# Patient Record
Sex: Female | Born: 1970 | Hispanic: No | Marital: Married | Smoking: Never smoker
Health system: Southern US, Community
[De-identification: ages and names within clinical notes are randomized; demographics above are authoritative.]

## PROBLEM LIST (undated history)

## (undated) DIAGNOSIS — D472 Monoclonal gammopathy: Secondary | ICD-10-CM

## (undated) DIAGNOSIS — I10 Essential (primary) hypertension: Secondary | ICD-10-CM

## (undated) DIAGNOSIS — G43909 Migraine, unspecified, not intractable, without status migrainosus: Secondary | ICD-10-CM

## (undated) DIAGNOSIS — J45909 Unspecified asthma, uncomplicated: Secondary | ICD-10-CM

## (undated) DIAGNOSIS — M069 Rheumatoid arthritis, unspecified: Secondary | ICD-10-CM

## (undated) HISTORY — DX: Essential (primary) hypertension: I10

## (undated) HISTORY — DX: Migraine, unspecified, not intractable, without status migrainosus: G43.909

## (undated) HISTORY — DX: Unspecified asthma, uncomplicated: J45.909

---

## 2000-09-18 ENCOUNTER — Other Ambulatory Visit: Admission: RE | Admit: 2000-09-18 | Discharge: 2000-09-18 | Payer: Self-pay | Admitting: Gynecology

## 2002-05-13 HISTORY — PX: REDUCTION MAMMAPLASTY: SUR839

## 2003-05-14 HISTORY — PX: BREAST REDUCTION SURGERY: SHX8

## 2004-05-13 HISTORY — PX: CHOLECYSTECTOMY: SHX55

## 2005-05-13 HISTORY — PX: BREAST BIOPSY: SHX20

## 2005-08-14 ENCOUNTER — Emergency Department (HOSPITAL_COMMUNITY): Admission: EM | Admit: 2005-08-14 | Discharge: 2005-08-14 | Payer: Self-pay | Admitting: Family Medicine

## 2005-08-29 ENCOUNTER — Encounter: Payer: Self-pay | Admitting: Internal Medicine

## 2005-09-13 ENCOUNTER — Emergency Department (HOSPITAL_COMMUNITY): Admission: EM | Admit: 2005-09-13 | Discharge: 2005-09-13 | Payer: Self-pay | Admitting: Family Medicine

## 2005-09-16 ENCOUNTER — Inpatient Hospital Stay (HOSPITAL_COMMUNITY): Admission: AD | Admit: 2005-09-16 | Discharge: 2005-09-16 | Payer: Self-pay | Admitting: *Deleted

## 2007-06-16 ENCOUNTER — Ambulatory Visit: Payer: Self-pay | Admitting: Internal Medicine

## 2007-06-16 DIAGNOSIS — J4550 Severe persistent asthma, uncomplicated: Secondary | ICD-10-CM | POA: Insufficient documentation

## 2007-06-16 DIAGNOSIS — J45909 Unspecified asthma, uncomplicated: Secondary | ICD-10-CM

## 2007-06-18 ENCOUNTER — Telehealth (INDEPENDENT_AMBULATORY_CARE_PROVIDER_SITE_OTHER): Payer: Self-pay | Admitting: *Deleted

## 2007-06-24 ENCOUNTER — Telehealth: Payer: Self-pay | Admitting: Internal Medicine

## 2007-06-24 ENCOUNTER — Ambulatory Visit: Payer: Self-pay | Admitting: Internal Medicine

## 2007-07-01 ENCOUNTER — Telehealth: Payer: Self-pay | Admitting: Internal Medicine

## 2007-07-02 ENCOUNTER — Telehealth (INDEPENDENT_AMBULATORY_CARE_PROVIDER_SITE_OTHER): Payer: Self-pay | Admitting: *Deleted

## 2007-07-08 ENCOUNTER — Ambulatory Visit: Payer: Self-pay | Admitting: Internal Medicine

## 2007-07-16 ENCOUNTER — Ambulatory Visit: Payer: Self-pay | Admitting: Internal Medicine

## 2007-07-20 ENCOUNTER — Telehealth (INDEPENDENT_AMBULATORY_CARE_PROVIDER_SITE_OTHER): Payer: Self-pay | Admitting: *Deleted

## 2007-07-21 ENCOUNTER — Telehealth (INDEPENDENT_AMBULATORY_CARE_PROVIDER_SITE_OTHER): Payer: Self-pay | Admitting: *Deleted

## 2007-07-31 ENCOUNTER — Ambulatory Visit: Payer: Self-pay | Admitting: Internal Medicine

## 2007-08-04 ENCOUNTER — Telehealth: Payer: Self-pay | Admitting: Internal Medicine

## 2007-09-08 ENCOUNTER — Ambulatory Visit: Payer: Self-pay | Admitting: Internal Medicine

## 2007-10-07 ENCOUNTER — Telehealth: Payer: Self-pay | Admitting: Internal Medicine

## 2007-10-08 ENCOUNTER — Ambulatory Visit: Payer: Self-pay | Admitting: Internal Medicine

## 2007-10-22 ENCOUNTER — Ambulatory Visit: Payer: Self-pay | Admitting: Internal Medicine

## 2007-11-02 ENCOUNTER — Encounter: Payer: Self-pay | Admitting: Adult Health

## 2007-11-02 ENCOUNTER — Ambulatory Visit: Payer: Self-pay | Admitting: Internal Medicine

## 2007-11-02 ENCOUNTER — Telehealth: Payer: Self-pay | Admitting: Internal Medicine

## 2007-11-02 DIAGNOSIS — J019 Acute sinusitis, unspecified: Secondary | ICD-10-CM | POA: Insufficient documentation

## 2007-11-03 ENCOUNTER — Telehealth (INDEPENDENT_AMBULATORY_CARE_PROVIDER_SITE_OTHER): Payer: Self-pay | Admitting: *Deleted

## 2007-11-06 ENCOUNTER — Telehealth (INDEPENDENT_AMBULATORY_CARE_PROVIDER_SITE_OTHER): Payer: Self-pay | Admitting: *Deleted

## 2007-11-19 ENCOUNTER — Ambulatory Visit: Payer: Self-pay | Admitting: Internal Medicine

## 2008-01-22 ENCOUNTER — Telehealth (INDEPENDENT_AMBULATORY_CARE_PROVIDER_SITE_OTHER): Payer: Self-pay | Admitting: *Deleted

## 2008-01-22 ENCOUNTER — Ambulatory Visit: Payer: Self-pay | Admitting: Internal Medicine

## 2008-02-11 ENCOUNTER — Telehealth: Payer: Self-pay | Admitting: Internal Medicine

## 2008-03-31 ENCOUNTER — Ambulatory Visit: Payer: Self-pay | Admitting: Internal Medicine

## 2008-07-01 ENCOUNTER — Ambulatory Visit: Payer: Self-pay | Admitting: Internal Medicine

## 2008-08-09 ENCOUNTER — Telehealth (INDEPENDENT_AMBULATORY_CARE_PROVIDER_SITE_OTHER): Payer: Self-pay | Admitting: *Deleted

## 2008-08-09 ENCOUNTER — Ambulatory Visit: Payer: Self-pay | Admitting: Internal Medicine

## 2013-07-02 ENCOUNTER — Encounter: Payer: Self-pay | Admitting: Internal Medicine

## 2013-07-02 ENCOUNTER — Ambulatory Visit (INDEPENDENT_AMBULATORY_CARE_PROVIDER_SITE_OTHER): Payer: BC Managed Care – PPO | Admitting: Internal Medicine

## 2013-07-02 VITALS — BP 124/78 | HR 95 | Temp 98.2°F | Ht 62.0 in | Wt 177.0 lb

## 2013-07-02 DIAGNOSIS — J45909 Unspecified asthma, uncomplicated: Secondary | ICD-10-CM

## 2013-07-02 MED ORDER — PREDNISONE 10 MG PO TABS: ORAL_TABLET | ORAL | Status: DC

## 2013-07-02 NOTE — Progress Notes (Signed)
Subjective:    Patient ID: Alexis Hall, female    DOB: 1971/03/20, 43 y.o.   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   History of Present Illness   07/02/2013 1st [place] [facility] office visit/ Epic Era Rydell Wiegel  Chief Complaint  Patient presents with  . Pulmonary Consult    Pt last seen in 2010. She c/o increased cough, chest tightness and SOB for the past 3-4 wks. Cough is non prod. She is SOB with or without any exertion. Using rescue inhaler approx 4 x per day.    Still on shots per vanWinkle plus singulair plus symbicort 160 and prn ppi   Caught a cold with sore throat then cough about one month prior to OV   > then tightness > rx zpak / predn and omeprazole 20 ac  Cough and sob tends to peak with cold air and activity and yet not  While sleeping, proair every 6 hours on avg whereas prior to onset not needing saba at all  No obvious day to day or daytime variabilty or assoc   cp or chest tightness, subjective wheeze overt sinus or hb symptoms. No unusual exp hx or h/o childhood pna/ asthma or knowledge of premature birth.  Sleeping ok without nocturnal  or early am exacerbation  of respiratory  c/o's or need for noct saba. Also denies any obvious fluctuation of symptoms with weather or environmental changes or other aggravating or alleviating factors except as outlined above   Current Medications, Allergies, Complete Past Medical History, Past Surgical History, Family History, and Social History were reviewed in [facility] electronic medical record.                Past Medical History:  EXTRINSIC ASTHMA, UNSPECIFIED (ICD-493.00)  - Allergy shots per Dr Jethro BolusGene [place]  - HFA 50% August 09, 2008  Depression  GERD    Family History:  allergies in mother  hypertension in mother  and father    Social History:  Patient never smoked.  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  HENT: Negative for congestion, dental problem, ear pain, nosebleeds, postnasal drip, rhinorrhea, sinus pressure, sneezing, sore throat, trouble swallowing and voice change.   Eyes: Negative for visual disturbance.  Respiratory: Positive for cough and shortness of breath. Negative for choking.   Cardiovascular: Negative for chest pain and leg swelling.  Gastrointestinal: Negative for vomiting, abdominal pain and diarrhea.  Genitourinary: Negative for difficulty urinating.  Musculoskeletal: Negative for arthralgias.  Skin: Negative for rash.  Neurological: Negative for tremors, syncope and headaches.  Hematological: Does not bruise/bleed easily.       Objective:   Physical Exam    Ambulatory healthy appearing in no acute distress. wt 178 > 185 August 09, 2008 > 07/02/2013 178  Afeb with normal vital signs  HEENT: nl dentition, turbinates, and orophanx. Nl external ear canals without cough reflex, sinus tenderness  Neck without JVD/Nodes/TM  Lungs clear to A and P bilaterally without cough on insp or exp maneuvers  RRR no s3 or murmur or increase in P2, no edema  Abd soft and benign with nl excursion in  the supine position. No bruits or organomegaly  Ext warm without calf tenderness, cyanosis clubbing  Skin warm and dry without lesions   CT chest since onset of present illness in [place] negative      Assessment & Plan:

## 2013-07-02 NOTE — Patient Instructions (Addendum)
Continue symbicort 160 Take 2 puffs first thing in am and then another 2 puffs about 12 hours later.   Work on inhaler technique:  relax and gently blow all the way out then take a nice smooth deep breath back in, triggering the inhaler at same time you start breathing in.  Hold for up to 5 seconds if you can.  Rinse and gargle with water when done   If your mouth or throat starts to bother you,   I suggest you time the inhaler to your dental care and after using the inhaler(s) brush teeth and tongue with a baking soda containing toothpaste and when you rinse this out, gargle with it first to see if this helps your mouth and throat.     Only use your albuterol as a rescue medication to be used if you can't catch your breath by resting or doing a relaxed purse lip breathing pattern.  - The less you use it, the better it will work when you need it. - Ok to use up to 2 puffs  every 4 hours if you must but call for immediate appointment if use goes up over your usual need - Don't leave home without it !!  (think of it like the spare tire for your car)   Omeprazole 20 mg take x 2 both before bfast and supper and add chlortrimeton 4 mg at bedtime  Prednisone 10 mg take  4 each am x 2 days,   2 each am x 2 days,  1 each am x 2 days and stop   Take delsym two tsp every 12 hours and supplement if needed with oxycodone up to 2 every 4 hours  to suppress the urge to cough. Swallowing water or using ice chips/non mint and menthol containing candies (such as lifesavers or sugarless jolly ranchers) are also effective.  You should rest your voice and avoid activities that you know make you cough.  Once you have eliminated the cough for 3 straight days try reducing the oxycone  first,  then the delsym as tolerated.    GERD (REFLUX)  is an extremely common cause of respiratory symptoms, many times with no significant heartburn at all.    It can be treated with medication, but also with lifestyle changes  including avoidance of late meals, excessive alcohol, smoking cessation, and avoid fatty foods, chocolate, peppermint, colas, red wine, and acidic juices such as orange juice.  NO MINT OR MENTHOL PRODUCTS SO NO COUGH DROPS  USE SUGARLESS CANDY INSTEAD (jolley ranchers or Stover's)  NO OIL BASED VITAMINS - use powdered substitutes.

## 2013-07-03 NOTE — Assessment & Plan Note (Addendum)
DDX of  difficult airways managment all start with A and  include Adherence, Ace Inhibitors, Acid Reflux, Active Sinus Disease, Alpha 1 Antitripsin deficiency, Anxiety masquerading as Airways dz,  ABPA,  allergy(esp in young), Aspiration (esp in elderly), Adverse effects of DPI,  Active smokers, plus two Bs  = Bronchiectasis and Beta blocker use..and one C= CHF  Adherence is always the initial "prime suspect" and is a multilayered concern that requires a "trust but verify" approach in every patient - starting with knowing how to use medications, especially inhalers, correctly, keeping up with refills and understanding the fundamental difference between maintenance and prns vs those medications only taken for a very short course and then stopped and not refilled. The proper method of use, as well as anticipated side effects, of a metered-dose inhaler are discussed and demonstrated to the patient. Improved effectiveness after extensive coaching during this visit to a level of approximately  90% (from a baseline of 50%)  so continue symbicort 160 2bid  ? Acid (or non-acid) GERD > always difficult to exclude as up to 75% of pts in some series report no assoc GI/ Heartburn symptoms> rec max (24h)  acid suppression and diet restrictions/ reviewed and instructions given in writing.   See instructions for specific recommendations which were reviewed directly with the patient who was given a copy with highlighter outlining the key components.

## 2013-07-09 ENCOUNTER — Telehealth: Payer: Self-pay | Admitting: Internal Medicine

## 2013-07-09 MED ORDER — PREDNISONE 10 MG PO TABS: ORAL_TABLET | ORAL | Status: DC

## 2013-07-09 NOTE — Telephone Encounter (Signed)
Called and spoke with pt and she stated that she finished the prednisone yesterday.  She stated that the  Dry cough and chest tightness started back today.  She is still taking the delsym bid, omeprazole 40 mg  Bid and the chlorpheneramine qhs.   Pt is requesting further recs for the cough and the chest tightness.  Please advise. Thanks  Allergies  Allergen Reactions  . Hydrocodone-Acetaminophen     REACTION: gi upset  . Fluconazole Rash

## 2013-07-09 NOTE — Telephone Encounter (Signed)
All we can heading into weekend is re-extend prednisone burst. IF this is not working, rov with Dr Sherene SiresWert or NP Tammy next week or go to ER or can call on call over weekend

## 2013-07-09 NOTE — Telephone Encounter (Signed)
Per MR extend pred, have advised pt of the rec's and that rx was sent to pharm. Pt aware and to f/u next week if not better.  Nothing further is needed

## 2013-09-01 ENCOUNTER — Telehealth: Payer: Self-pay | Admitting: Internal Medicine

## 2013-09-01 ENCOUNTER — Encounter: Payer: Self-pay | Admitting: Emergency Medicine

## 2013-09-01 ENCOUNTER — Ambulatory Visit (INDEPENDENT_AMBULATORY_CARE_PROVIDER_SITE_OTHER): Payer: BC Managed Care – PPO | Admitting: Emergency Medicine

## 2013-09-01 VITALS — BP 122/78 | HR 102 | Ht 62.0 in | Wt 173.6 lb

## 2013-09-01 DIAGNOSIS — J45909 Unspecified asthma, uncomplicated: Secondary | ICD-10-CM

## 2013-09-01 DIAGNOSIS — J309 Allergic rhinitis, unspecified: Secondary | ICD-10-CM | POA: Insufficient documentation

## 2013-09-01 MED ORDER — PREDNISONE 10 MG PO TABS: 10.0000 mg | ORAL_TABLET | Freq: Every day | ORAL | Status: DC

## 2013-09-01 MED ORDER — PREDNISONE 10 MG PO TABS: ORAL_TABLET | ORAL | Status: DC

## 2013-09-01 MED ORDER — DOXYCYCLINE HYCLATE 100 MG PO TABS: 100.0000 mg | ORAL_TABLET | Freq: Two times a day (BID) | ORAL | Status: DC

## 2013-09-01 NOTE — Patient Instructions (Signed)
Please take prednisone and doxycycline as directed Continue your other medications as you are taking them Follow with Dr Sherene SiresWert in 1 month to discuss your improvement and whether you need further workup like a CT scan of the sinuses.

## 2013-09-01 NOTE — Progress Notes (Signed)
  Subjective:    Patient ID: Alexis Hall, female    DOB: [birth year], 43 y.o.   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, with known history of Asthma with poor control since 03/2007  > better with rx of rhinitis/ gerd and maint on symbicort and allergy rx per [facility].   Acute OV 09/01/13 -- hx asthma, allergic rhinitis, probably GERD. On Symbicort and allergy immunotherapy. Followed by Dr Sherene SiresWert.  She was treated for an AE in March and had done well for about 7 weeks until she developed clear drainage, more cough, dyspnea. She is on singulair, nasal steroids, allegra, patonase. She developed nasal and chest purulent mucous.     Past Medical History:  EXTRINSIC ASTHMA, UNSPECIFIED (ICD-493.00)  - Allergy shots per Dr Jethro BolusGene [place]  - HFA 50% August 09, 2008  Depression  GERD    Family History:  allergies in mother  hypertension in mother and father    Social History:  Patient never smoked.  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  HENT: Negative for congestion, dental problem, ear pain, nosebleeds, postnasal drip, rhinorrhea, sinus pressure, sneezing, sore throat, trouble swallowing and voice change.   Eyes: Negative for visual disturbance.  Respiratory: Positive for cough and shortness of breath. Negative for choking.   Cardiovascular: Negative for chest pain and leg swelling.  Gastrointestinal: Negative for vomiting, abdominal pain and diarrhea.  Genitourinary: Negative for difficulty urinating.  Musculoskeletal: Negative for arthralgias.  Skin: Negative for rash.  Neurological: Negative for tremors, syncope and headaches.  Hematological: Does not bruise/bleed easily.       Objective:   Physical Exam Filed Vitals:   09/01/13 1510  BP: 122/78  Pulse: 102  Height: 5\' 2"  (1.575 m)  Weight: 173 lb 9.6 oz (78.744 kg)  SpO2: 100%  Gen: Pleasant,  well-nourished, in no distress,  normal affect  ENT: No lesions,  mouth clear,  oropharynx clear, some PND clear  Neck: No JVD, no TMG, no carotid bruits  Lungs: No use of accessory muscles, clear without rales or rhonchi, no wheeze  Cardiovascular: RRR, heart sounds normal, no murmur or gallops, no peripheral edema  Musculoskeletal: No deformities, no cyanosis or clubbing  Neuro: alert, non focal  Skin: Warm, no lesions or rashes     Assessment & Plan:  Extrinsic asthma, unspecified No wheeze on exam today, but paroxysms of cough every deep inspiration. Yellow sputum, possible bronchitis +/- sinusitis. Doubt this is a true flare (yet). Will treat aggressively, attempt to quit acute and sub-acute symptoms. If she has recurrent sx then there is likely a driving factor - GERD, allergies (although maximally treated), chronic sinus disease. Will need further workup at that time. She will follow with Dr Sherene SiresWert

## 2013-09-01 NOTE — Telephone Encounter (Signed)
Spoke with pt. She is scheduled to see RB today for acute visit

## 2013-09-01 NOTE — Assessment & Plan Note (Addendum)
No wheeze on exam today, but paroxysms of cough every deep inspiration. Yellow sputum, possible bronchitis +/- sinusitis. Doubt this is a true flare (yet). Will treat aggressively, attempt to quit acute and sub-acute symptoms. If she has recurrent sx then there is likely a driving factor - GERD, allergies (although maximally treated), chronic sinus disease. Will need further workup at that time. She will follow with Dr Sherene SiresWert

## 2013-09-30 ENCOUNTER — Ambulatory Visit: Payer: BC Managed Care – PPO | Admitting: Internal Medicine

## 2013-10-14 ENCOUNTER — Ambulatory Visit: Payer: BC Managed Care – PPO | Admitting: Internal Medicine

## 2013-10-18 ENCOUNTER — Encounter: Payer: Self-pay | Admitting: Internal Medicine

## 2013-10-18 ENCOUNTER — Encounter (INDEPENDENT_AMBULATORY_CARE_PROVIDER_SITE_OTHER): Payer: Self-pay

## 2013-10-18 ENCOUNTER — Ambulatory Visit (INDEPENDENT_AMBULATORY_CARE_PROVIDER_SITE_OTHER): Payer: BC Managed Care – PPO | Admitting: Internal Medicine

## 2013-10-18 VITALS — BP 122/78 | HR 90 | Temp 98.7°F | Ht 62.0 in | Wt 172.0 lb

## 2013-10-18 DIAGNOSIS — J45909 Unspecified asthma, uncomplicated: Secondary | ICD-10-CM

## 2013-10-18 NOTE — Progress Notes (Signed)
Subjective:    Patient ID: Alexis Hall, female    DOB: 07/13/1970, 43 y.o.   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     History of Present Illness  07/02/2013 1st [facility] office visit/ Epic Era Kaityln Kallstrom  Chief Complaint  Patient presents with  . Pulmonary Consult    Pt last seen in 2010. She c/o increased cough, chest tightness and SOB for the past 3-4 wks. Cough is non prod. She is SOB with or without any exertion. Using rescue inhaler approx 4 x per day.   Still on shots per vanWinkle plus singulair plus symbicort 160 and prn ppi  Caught a cold with sore throat then cough about one month prior to OV   > then tightness > rx zpak / pred  and omeprazole 20 ac.  Cough and sob tend to peak with cold air and activity and yet not  While sleeping, proair every 6 hours on avg whereas prior to onset not needing saba at all rec Continue symbicort 160 Take 2 puffs first thing in am and then another 2 puffs about 12 hours later.  Work on inhaler technique:  Only  use your albuterol as a rescue medication to be used if you can't catch your breath  Omeprazole 20 mg take x 2 both before bfast and supper and add chlortrimeton 4 mg at bedtime Prednisone 10 mg take  4 each am x 2 days,   2 each am x 2 days,  1 each am x 2 days and stop  Take delsym two tsp every 12 hours and supplement if needed with oxycodone up to 2 every 4 hours    GERD diet   09/01/13 Dr Delton Coombes eval for acute exac rx pred/doxy   10/18/2013 f/u ov/Theus Espin re: chronic asthma on symbicort 160 /singulair/ shots per Irena Cords  Chief Complaint  Patient presents with  . Follow-up    Pt states overall doing well.  She is using rescue inhaler 1-2 times per wk on average. She states that since her last visit, she has  noticed that she gets SOB when she lies completely flat and has to sleep propped up.    has to prop up within a few mins, no change p saba, needs saba daytime walking up hill to get to work.  No obvious day to day or daytime variabilty or assoc chronic cough or cp or chest tightness, subjective wheeze overt sinus or hb symptoms. No unusual exp hx or h/o childhood pna/ asthma or knowledge of premature birth.  Sleeping ok without nocturnal  or early am exacerbation  of respiratory  c/o's or need for noct saba. Also denies any obvious fluctuation of symptoms with weather or environmental changes or other aggravating or alleviating factors except as outlined above   Current Medications, Allergies, Complete Past Medical History, Past Surgical History, Family History, and Social History were reviewed in [employer] record.  ROS  The following are not active complaints unless bolded sore throat, dysphagia, dental problems, itching, sneezing,  nasal congestion or excess/ purulent secretions, ear ache,   fever, chills, sweats, unintended wt loss, pleuritic or exertional cp, hemoptysis,  orthopnea pnd or leg swelling, presyncope, palpitations, heartburn, abdominal pain, anorexia, nausea, vomiting,  diarrhea  or change in bowel or urinary habits, change in stools or urine, dysuria,hematuria,  rash, arthralgias, visual complaints, headache, numbness weakness or ataxia or problems with walking or coordination,  change in mood/affect or memory.             Past Medical History:  EXTRINSIC ASTHMA, UNSPECIFIED (ICD-493.00)  - Allergy shots per Dr Jethro BolusGene  / [facility] - HFA 50% August 09, 2008  Depression  GERD    Family History:  allergies in mother  hypertension in mother and father    Social History:  Patient never smoked.  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   wt 178  > 185 August 09, 2008 > 07/02/2013 178 > 10/18/2013  172   HEENT: nl dentition, turbinates, and orophanx. Nl external ear canals without cough reflex, sinus tenderness  Neck without JVD/Nodes/TM  Lungs clear to A and P bilaterally without cough on insp or exp maneuvers  RRR no s3 or murmur or increase in P2, no edema  Abd soft and benign with nl excursion in the supine position. No bruits or organomegaly  Ext warm without calf tenderness, cyanosis clubbing  Skin warm and dry without lesions   CT chest since onset of present illness in [place] negative      Assessment & Plan:

## 2013-10-18 NOTE — Patient Instructions (Addendum)
Add Pepcid ac 20 mg at bedtime  GERD (REFLUX)  is an extremely common cause of respiratory symptoms, many times with no significant heartburn at all.    It can be treated with medication, but also with lifestyle changes including avoidance of late meals, excessive alcohol, smoking cessation, and avoid fatty foods, chocolate, peppermint, colas, red wine, and acidic juices such as orange juice.  NO MINT OR MENTHOL PRODUCTS SO NO COUGH DROPS  USE SUGARLESS CANDY INSTEAD (jolley ranchers or Stover's)  NO OIL BASED VITAMINS - use powdered substitutes.  Return if breathing worse on needing more than twice weekly rescue inhaler.

## 2013-10-19 NOTE — Assessment & Plan Note (Addendum)
-   Spirometry 10/18/13 wnl including fef 25-75   Only sign problem now is noct sob not responsive to saba ? Related to wt gain/ noct GERD? But strongly doubt asthma since fef25-75 nl daytime (high correlation with noct asthma).  rec diet/ h2 hs/prop up   Pulmonary f/u can be prn as sees Dr Madie Reno for shots.

## 2013-12-29 ENCOUNTER — Emergency Department (HOSPITAL_BASED_OUTPATIENT_CLINIC_OR_DEPARTMENT_OTHER)
Admission: EM | Admit: 2013-12-29 | Discharge: 2013-12-29 | Disposition: A | Discharge status: Home / Self Care | Payer: BC Managed Care – PPO | Attending: Emergency Medicine | Admitting: Emergency Medicine

## 2013-12-29 ENCOUNTER — Emergency Department (HOSPITAL_BASED_OUTPATIENT_CLINIC_OR_DEPARTMENT_OTHER): Payer: BC Managed Care – PPO

## 2013-12-29 ENCOUNTER — Encounter (HOSPITAL_BASED_OUTPATIENT_CLINIC_OR_DEPARTMENT_OTHER): Payer: Self-pay | Admitting: Emergency Medicine

## 2013-12-29 DIAGNOSIS — J45909 Unspecified asthma, uncomplicated: Secondary | ICD-10-CM | POA: Diagnosis not present

## 2013-12-29 DIAGNOSIS — IMO0002 Reserved for concepts with insufficient information to code with codable children: Secondary | ICD-10-CM | POA: Insufficient documentation

## 2013-12-29 DIAGNOSIS — Z79899 Other long term (current) drug therapy: Secondary | ICD-10-CM | POA: Diagnosis not present

## 2013-12-29 DIAGNOSIS — G43909 Migraine, unspecified, not intractable, without status migrainosus: Secondary | ICD-10-CM | POA: Insufficient documentation

## 2013-12-29 DIAGNOSIS — R519 Headache, unspecified: Secondary | ICD-10-CM

## 2013-12-29 DIAGNOSIS — R51 Headache: Secondary | ICD-10-CM | POA: Insufficient documentation

## 2013-12-29 DIAGNOSIS — I10 Essential (primary) hypertension: Secondary | ICD-10-CM | POA: Insufficient documentation

## 2013-12-29 MED ORDER — METOCLOPRAMIDE HCL 5 MG/ML IJ SOLN
10.0000 mg | Freq: Once | INTRAMUSCULAR | Status: AC
  Administered 2013-12-29: 10 mg via INTRAMUSCULAR
  Filled 2013-12-29: qty 2

## 2013-12-29 MED ORDER — KETOROLAC TROMETHAMINE 60 MG/2ML IM SOLN
60.0000 mg | Freq: Once | INTRAMUSCULAR | Status: AC
  Administered 2013-12-29: 60 mg via INTRAMUSCULAR
  Filled 2013-12-29: qty 2

## 2013-12-29 MED ORDER — DIPHENHYDRAMINE HCL 50 MG/ML IJ SOLN
25.0000 mg | Freq: Once | INTRAMUSCULAR | Status: AC
  Administered 2013-12-29: 25 mg via INTRAMUSCULAR
  Filled 2013-12-29: qty 1

## 2013-12-29 NOTE — ED Provider Notes (Signed)
CSN: [id number]     Arrival date & time 12/29/13  1730 History   First MD Initiated Contact with Patient 12/29/13 1756     Chief Complaint  Patient presents with  . Headache     (Consider location/radiation/quality/duration/timing/severity/associated sxs/prior Treatment) HPI Comments: Pt c/o headache that started earlier today. Pt states that she has a history of headaches and this is similar but she had some blurred vision and some word finding difficulty. Pt states that she took her imitrex and phenergan with relief but the symptoms are returning which is unusual. Pt states that she was told by her neurologist to come in  The history is provided by the patient. No language interpreter was used.    Past Medical History  Diagnosis Date  . Asthma   . Hypertension   . Migraines    Past Surgical History  Procedure Laterality Date  . Cholecystectomy  05-13-04  . Breast reduction surgery  2005   Family History  Problem Relation Age of Onset  . Allergies Mother   . Asthma Mother   . Rheum arthritis Mother    History  Substance Use Topics  . Smoking status: Never Smoker   . Smokeless tobacco: Never Used  . Alcohol Use: No   OB History   Grav Para Term Preterm Abortions TAB SAB Ect Mult Living                 Review of Systems  Constitutional: Negative.   Respiratory: Negative.   Cardiovascular: Negative.       Allergies  Hydrocodone-acetaminophen and Fluconazole  Home Medications   Prior to Admission medications   Medication Sig Start Date End Date Taking? Authorizing Provider  albuterol (PROAIR HFA) 108 (90 BASE) MCG/ACT inhaler Inhale 2 puffs into the lungs every 6 (six) hours as needed for wheezing or shortness of breath.    Historical Provider, MD  ARIPiprazole (ABILIFY) 2 MG tablet Take 3 mg by mouth daily.    Historical Provider, MD  budesonide-formoterol (SYMBICORT) 160-4.5 MCG/ACT inhaler Inhale 2 puffs into the lungs 2 (two) times daily.    Historical  Provider, MD  buPROPion (WELLBUTRIN XL) 150 MG 24 hr tablet Take 150 mg by mouth daily.    Historical Provider, MD  dextromethorphan-guaiFENesin (MUCINEX DM) 30-600 MG per 12 hr tablet Take 1 tablet by mouth 2 (two) times daily as needed for cough.    Historical Provider, MD  fexofenadine (ALLEGRA) 180 MG tablet Take 180 mg by mouth daily.    Historical Provider, MD  fluticasone (FLONASE) 50 MCG/ACT nasal spray Place 2 sprays into both nostrils daily.    Historical Provider, MD  HYDROcodone-homatropine (HYCODAN) 5-1.5 MG/5ML syrup Take 5 mLs by mouth every 4 (four) hours as needed for cough.    Historical Provider, MD  montelukast (SINGULAIR) 10 MG tablet Take 10 mg by mouth at bedtime.    Historical Provider, MD  nortriptyline (PAMELOR) 50 MG capsule Take 50 mg by mouth at bedtime.    Historical Provider, MD  Olopatadine HCl (PATANASE NA) Place 2 sprays into the nose 2 (two) times daily.    Historical Provider, MD  omeprazole (PRILOSEC) 20 MG capsule Take 40 mg by mouth daily before breakfast.     Historical Provider, MD  Pseudoephedrine HCl (SUDAFED 12 HOUR PO) Take 1 tablet by mouth 2 (two) times daily.    Historical Provider, MD  Topiramate ER (TROKENDI XR) 100 MG CP24 Take 1 capsule by mouth daily.    Historical Provider,  MD  UNABLE TO FIND Allergy Inj every wk per Dr. Irena CordsVan Winkle    Historical Provider, MD  zolpidem (AMBIEN) 5 MG tablet Take 5 mg by mouth at bedtime as needed for sleep.    Historical Provider, MD   BP 140/76  Pulse 76  Temp(Src) 98 F (36.7 C) (Oral)  Resp 18  Ht 5\' 2"  (1.575 m)  Wt 167 lb (75.751 kg)  BMI 30.54 kg/m2  SpO2 100%  LMP 12/03/2013 Physical Exam  Nursing note and vitals reviewed. Constitutional: She is oriented to person, place, and time.  HENT:  Head: Normocephalic and atraumatic.  Cardiovascular: Normal rate and regular rhythm.   Pulmonary/Chest: Effort normal and breath sounds normal.  Abdominal: Soft. There is no tenderness.  Musculoskeletal:  Normal range of motion.  Neurological: She is alert and oriented to person, place, and time. She exhibits abnormal muscle tone. Coordination and gait normal. GCS eye subscore is 4. GCS verbal subscore is 5. GCS motor subscore is 6.  Skin: Skin is warm and dry.  Psychiatric: She has a normal mood and affect.    ED Course  Procedures (including critical care time) Labs Review Labs Reviewed - No data to display  Imaging Review Ct Head Wo Contrast  12/29/2013   CLINICAL DATA:  Blurred vision, migraine  EXAM: CT HEAD WITHOUT CONTRAST  TECHNIQUE: Contiguous axial images were obtained from the base of the skull through the vertex without intravenous contrast.  COMPARISON:  None.  FINDINGS: No skull fracture is noted. Paranasal sinuses and mastoid air cells are unremarkable. No intracranial hemorrhage, mass effect or midline shift. The gray and white-matter differentiation is preserved. No hydrocephalus. No acute infarction. No mass lesion is noted on this unenhanced scan.  IMPRESSION: No acute intracranial abnormality.   Electronically Signed   By: Natasha MeadLiviu  Pop M.D.   On: 12/29/2013 18:28     EKG Interpretation None      MDM   Final diagnoses:  Headache, unspecified headache type    Pt is feeling better after the migraine cocktail. All symptoms have resolved. Discussed symptoms with DR. Roseanne RenoStewart with neurology he states that no further work up is need and pt can follow up with with her neurologist on an outpt basis    Teressa LowerVrinda Lucresha Dismuke, NP 12/29/13 1920

## 2013-12-29 NOTE — Discharge Instructions (Signed)
Follow up with your neurologist as discussed Migraine Headache A migraine headache is very bad, throbbing pain on one or both sides of your head. Talk to your doctor about what things may bring on (trigger) your migraine headaches. HOME CARE  Only take medicines as told by your doctor.  Lie down in a dark, quiet room when you have a migraine.  Keep a journal to find out if certain things bring on migraine headaches. For example, write down:  What you eat and drink.  How much sleep you get.  Any change to your diet or medicines.  Lessen how much alcohol you drink.  Quit smoking if you smoke.  Get enough sleep.  Lessen any stress in your life.  Keep lights dim if bright lights bother you or make your migraines worse. GET HELP RIGHT AWAY IF:   Your migraine becomes really bad.  You have a fever.  You have a stiff neck.  You have trouble seeing.  Your muscles are weak, or you lose muscle control.  You lose your balance or have trouble walking.  You feel like you will pass out (faint), or you pass out.  You have really bad symptoms that are different than your first symptoms. MAKE SURE YOU:   Understand these instructions.  Will watch your condition.  Will get help right away if you are not doing well or get worse. Document Released: 02/06/2008 Document Revised: 07/22/2011 Document Reviewed: 01/04/2013 [facility] Patient Information 2015 [employer] [place]. This information is not intended to replace advice given to you by your health care provider. Make sure you discuss any questions you have with your health care provider.

## 2013-12-29 NOTE — ED Notes (Signed)
Reports HA started earlier, Sts having some trouble finding words. Reports taking imitrex and phenergan with some relief.

## 2013-12-29 NOTE — ED Notes (Signed)
Patient transported to CT 

## 2013-12-30 NOTE — ED Provider Notes (Signed)
Medical screening examination/treatment/procedure(s) were performed by non-physician practitioner and as supervising physician I was immediately available for consultation/collaboration.   Megan Docherty, MD 12/30/13 1134 

## 2014-02-25 ENCOUNTER — Other Ambulatory Visit: Payer: Self-pay

## 2014-07-14 ENCOUNTER — Other Ambulatory Visit: Payer: Self-pay

## 2014-07-14 DIAGNOSIS — Z1231 Encounter for screening mammogram for malignant neoplasm of breast: Secondary | ICD-10-CM

## 2014-07-20 ENCOUNTER — Ambulatory Visit
Admission: RE | Admit: 2014-07-20 | Discharge: 2014-07-20 | Disposition: A | Discharge status: Home / Self Care | Payer: BC Managed Care – PPO | Source: Ambulatory Visit

## 2014-07-20 DIAGNOSIS — Z1231 Encounter for screening mammogram for malignant neoplasm of breast: Secondary | ICD-10-CM

## 2014-07-22 ENCOUNTER — Ambulatory Visit: Payer: BC Managed Care – PPO

## 2015-06-26 ENCOUNTER — Other Ambulatory Visit: Payer: Self-pay

## 2015-06-26 DIAGNOSIS — Z1231 Encounter for screening mammogram for malignant neoplasm of breast: Secondary | ICD-10-CM

## 2015-07-21 ENCOUNTER — Ambulatory Visit: Payer: BC Managed Care – PPO

## 2015-08-04 ENCOUNTER — Ambulatory Visit
Admission: RE | Admit: 2015-08-04 | Discharge: 2015-08-04 | Disposition: A | Discharge status: Home / Self Care | Payer: BC Managed Care – PPO | Source: Ambulatory Visit

## 2015-08-04 DIAGNOSIS — Z1231 Encounter for screening mammogram for malignant neoplasm of breast: Secondary | ICD-10-CM

## 2016-07-12 ENCOUNTER — Other Ambulatory Visit: Payer: Self-pay | Admitting: Family Medicine

## 2016-07-12 DIAGNOSIS — Z1231 Encounter for screening mammogram for malignant neoplasm of breast: Secondary | ICD-10-CM

## 2016-07-22 ENCOUNTER — Emergency Department (HOSPITAL_BASED_OUTPATIENT_CLINIC_OR_DEPARTMENT_OTHER): Payer: BC Managed Care – PPO

## 2016-07-22 ENCOUNTER — Encounter (HOSPITAL_BASED_OUTPATIENT_CLINIC_OR_DEPARTMENT_OTHER): Payer: Self-pay | Admitting: *Deleted

## 2016-07-22 ENCOUNTER — Emergency Department (HOSPITAL_BASED_OUTPATIENT_CLINIC_OR_DEPARTMENT_OTHER)
Admission: EM | Admit: 2016-07-22 | Discharge: 2016-07-22 | Disposition: A | Discharge status: Home / Self Care | Payer: BC Managed Care – PPO | Attending: Emergency Medicine | Admitting: Emergency Medicine

## 2016-07-22 DIAGNOSIS — J45909 Unspecified asthma, uncomplicated: Secondary | ICD-10-CM | POA: Insufficient documentation

## 2016-07-22 DIAGNOSIS — Z79899 Other long term (current) drug therapy: Secondary | ICD-10-CM | POA: Diagnosis not present

## 2016-07-22 DIAGNOSIS — R079 Chest pain, unspecified: Secondary | ICD-10-CM | POA: Diagnosis not present

## 2016-07-22 LAB — CBC
HCT: 36.8 % (ref 36.0–46.0)
Hemoglobin: 12.2 g/dL (ref 12.0–15.0)
MCH: 32.1 pg (ref 26.0–34.0)
MCHC: 33.2 g/dL (ref 30.0–36.0)
MCV: 96.8 fL (ref 78.0–100.0)
Platelets: 173 10*3/uL (ref 150–400)
RBC: 3.8 MIL/uL — ABNORMAL LOW (ref 3.87–5.11)
RDW: 12.7 % (ref 11.5–15.5)
WBC: 5.6 10*3/uL (ref 4.0–10.5)

## 2016-07-22 LAB — COMPREHENSIVE METABOLIC PANEL
ALBUMIN: 4.1 g/dL (ref 3.5–5.0)
ALT: 20 U/L (ref 14–54)
ANION GAP: 7 (ref 5–15)
AST: 20 U/L (ref 15–41)
Alkaline Phosphatase: 52 U/L (ref 38–126)
BUN: 14 mg/dL (ref 6–20)
CO2: 20 mmol/L — ABNORMAL LOW (ref 22–32)
Calcium: 9.2 mg/dL (ref 8.9–10.3)
Chloride: 110 mmol/L (ref 101–111)
Creatinine, Ser: 0.98 mg/dL (ref 0.44–1.00)
GFR calc Af Amer: >60 mL/min (ref >60)
GFR calc non Af Amer: >60 mL/min (ref >60)
GLUCOSE: 86 mg/dL (ref 65–99)
Potassium: 3.4 mmol/L — ABNORMAL LOW (ref 3.5–5.1)
SODIUM: 137 mmol/L (ref 135–145)
TOTAL PROTEIN: 7.3 g/dL (ref 6.5–8.1)
Total Bilirubin: 0.5 mg/dL (ref 0.3–1.2)

## 2016-07-22 LAB — D-DIMER, QUANTITATIVE: D-Dimer, Quant: 0.3 ug/mL-FEU (ref 0.00–0.50)

## 2016-07-22 LAB — TROPONIN I: Troponin I: <0.03 ng/mL (ref <0.03)

## 2016-07-22 MED ORDER — ASPIRIN 81 MG PO CHEW
324.0000 mg | CHEWABLE_TABLET | Freq: Once | ORAL | Status: AC
  Administered 2016-07-22: 324 mg via ORAL
  Filled 2016-07-22: qty 4

## 2016-07-22 MED ORDER — GI COCKTAIL ~~LOC~~
30.0000 mL | Freq: Once | ORAL | Status: AC
  Administered 2016-07-22: 30 mL via ORAL
  Filled 2016-07-22: qty 30

## 2016-07-22 NOTE — ED Provider Notes (Signed)
[facility] Provider Note   CSN: [id number] Arrival date & time: 07/22/16  1527  By signing my name below, I, Modena Jansky, attest that this documentation has been prepared under the direction and in the presence of Nira Conn, MD. Electronically Signed: Modena Jansky, Scribe. 07/22/2016. 4:19 PM.  History   Chief Complaint Chief Complaint  Patient presents with  . Chest Pain   The history is provided by the patient. No language interpreter was used.   HPI Comments: Alexis Hall is a 46 y.o. female with a PMHx of HTN and asthma who presents to the Emergency Department complaining of intermittent moderate right-sided chest pain that started about 2 days ago. She states her episodes last at least 30 minutes and became longer in duration today. No known injury. She took Naproxen, Gas-X, and Nexium without relief. She describes the pain as non-radiating dull sensation that later become throbbing. She describes the pain as exacerbated by ambulation. She reports associated symptoms of throat "tightness", SOB, and dry cough. She took albuterol inhaler for SOB with minimal relief. She denies any hx of similar complaint, hx of smoking, hx of DM/MI, family hx of sudden MI, hx of stroke, hx of cancer, recent travel/surgery, hormone therapy use, fever, congestion, abdominal pain, leg swelling, nausea, vomiting, or other complaints.     PCP: Elijio Miles, MD  Past Medical History:  Diagnosis Date  . Asthma   . Hypertension   . Migraines     Patient Active Problem List   Diagnosis Date Noted  . Allergic rhinitis 09/01/2013  . SINUSITIS, ACUTE 11/02/2007  . Extrinsic asthma, unspecified 06/16/2007    Past Surgical History:  Procedure Laterality Date  . BREAST REDUCTION SURGERY  2005  . CHOLECYSTECTOMY  05-13-04    OB History    No data available       Home Medications    Prior to Admission medications   Medication Sig Start Date End Date Taking? Authorizing  Provider  albuterol (PROAIR HFA) 108 (90 BASE) MCG/ACT inhaler Inhale 2 puffs into the lungs every 6 (six) hours as needed for wheezing or shortness of breath.   Yes Historical Provider, MD  ARIPiprazole (ABILIFY) 2 MG tablet Take 3 mg by mouth daily.   Yes Historical Provider, MD  budesonide-formoterol (SYMBICORT) 160-4.5 MCG/ACT inhaler Inhale 2 puffs into the lungs 2 (two) times daily.   Yes Historical Provider, MD  buPROPion (WELLBUTRIN XL) 150 MG 24 hr tablet Take 150 mg by mouth daily.   Yes Historical Provider, MD  cyproheptadine (PERIACTIN) 4 MG tablet Take 4 mg by mouth 2 (two) times daily.   Yes Historical Provider, MD  fexofenadine (ALLEGRA) 180 MG tablet Take 180 mg by mouth daily.   Yes Historical Provider, MD  fluticasone (FLONASE) 50 MCG/ACT nasal spray Place 2 sprays into both nostrils daily.   Yes Historical Provider, MD  Pseudoephedrine HCl (SUDAFED 12 HOUR PO) Take 1 tablet by mouth 2 (two) times daily.   Yes Historical Provider, MD  tiotropium (SPIRIVA) 18 MCG inhalation capsule Place 18 mcg into inhaler and inhale daily.   Yes Historical Provider, MD  Topiramate ER (TROKENDI XR) 100 MG CP24 Take 1 capsule by mouth daily.   Yes Historical Provider, MD  zolpidem (AMBIEN) 5 MG tablet Take 5 mg by mouth at bedtime as needed for sleep.   Yes Historical Provider, MD  montelukast (SINGULAIR) 10 MG tablet Take 10 mg by mouth at bedtime.    Historical Provider, MD  Family History Family History  Problem Relation Age of Onset  . Allergies Mother   . Asthma Mother   . Rheum arthritis Mother     Social History Social History  Substance Use Topics  . Smoking status: Never Smoker  . Smokeless tobacco: Never Used  . Alcohol use No     Allergies   Hydrocodone-acetaminophen and Fluconazole   Review of Systems Review of Systems A complete 10 system review of systems was obtained and all systems are negative except as noted in the HPI and PMH.   Physical Exam Updated  Vital Signs BP 117/76   Pulse 79   Temp 98.1 F (36.7 C) (Oral)   Resp 19   Ht 5\' 2"  (1.575 m)   Wt 139 lb (63 kg)   LMP 07/22/2016   SpO2 100%   BMI 25.42 kg/m   Physical Exam  Constitutional: She is oriented to person, place, and time. She appears well-developed and well-nourished. No distress.  HENT:  Head: Normocephalic and atraumatic.  Nose: Nose normal.  Eyes: Conjunctivae and EOM are normal. Pupils are equal, round, and reactive to light. Right eye exhibits no discharge. Left eye exhibits no discharge. No scleral icterus.  Neck: Normal range of motion. Neck supple.  Cardiovascular: Normal rate and regular rhythm.  Exam reveals no gallop and no friction rub.   No murmur heard. Pulmonary/Chest: Effort normal and breath sounds normal. No stridor. No respiratory distress. She has no rales. She exhibits tenderness.    Abdominal: Soft. She exhibits no distension. There is no tenderness.  Musculoskeletal: She exhibits no edema or tenderness.  Neurological: She is alert and oriented to person, place, and time.  Skin: Skin is warm and dry. No rash noted. She is not diaphoretic. No erythema.  Psychiatric: She has a normal mood and affect.  Vitals reviewed.    ED Treatments / Results  DIAGNOSTIC STUDIES: Oxygen Saturation is 100% on RA, normal by my interpretation.    COORDINATION OF CARE: 4:23 PM- Pt advised of plan for treatment and pt agrees.  Labs (all labs ordered are listed, but only abnormal results are displayed) Labs Reviewed  CBC - Abnormal; Notable for the following:       Result Value   RBC 3.80 (*)    All other components within normal limits  COMPREHENSIVE METABOLIC PANEL - Abnormal; Notable for the following:    Potassium 3.4 (*)    CO2 20 (*)    All other components within normal limits  D-DIMER, QUANTITATIVE (NOT AT [facility])  TROPONIN I  TROPONIN I    EKG  EKG Interpretation  Date/Time:  Monday July 22 2016 15:39:21 EDT Ventricular Rate:  91 PR  Interval:    QRS Duration: 84 QT Interval:  351 QTC Calculation: 432 R Axis:   95 Text Interpretation:  Sinus rhythm Borderline right axis deviation Baseline wander in lead(s) V5 No STEMI No old tracing to compare Confirmed by [facility] MD, PEDRO [phone]) on 07/22/2016 3:44:28 PM       Radiology Dg Chest 2 View  Result Date: 07/22/2016 CLINICAL DATA:  Intermittent RIGHT side chest pain with shortness of breath for the past 2 days, history asthma, hypertension EXAM: CHEST  2 VIEW COMPARISON:  07/13/2015 FINDINGS: Normal heart size, mediastinal contours, and pulmonary vascularity. Lungs clear. No pleural effusion or pneumothorax. Bones unremarkable. Surgical clips RIGHT upper quadrant question prior cholecystectomy. IMPRESSION: No acute abnormalities. Electronically Signed   By: Ulyses Southward M.D.   On: 07/22/2016 16:51  Procedures Procedures (including critical care time)  Medications Ordered in ED Medications  aspirin chewable tablet 324 mg (324 mg Oral Given 07/22/16 1642)  gi cocktail (Maalox,Lidocaine,Donnatal) (30 mLs Oral Given 07/22/16 1642)     Initial Impression / Assessment and Plan / ED Course  I have reviewed the triage vital signs and the nursing notes.  Pertinent labs & imaging results that were available during my care of the patient were reviewed by me and considered in my medical decision making (see chart for details).     Atypical chest pain highly inconsistent with ACS. EKG without acute ischemic changes service of pericarditis. Initial troponin negative. HEAR <4. Appropriate for delta troponin.  Chest x-ray without evidence suggestive of pneumonia, pneumothorax, pneumomediastinum.  No abnormal contour of the mediastinum to suggest dissection. No evidence of acute injuries.  D-dimer negative. Doubt pulmonary embolism. Presentation is not classic for dissection or esophageal perforation.  Delta trop negative.  The patient is safe for discharge with strict return  precautions.  Final Clinical Impressions(s) / ED Diagnoses   Final diagnoses:  Chest pain  Nonspecific chest pain   Disposition: Discharge  Condition: Good  I have discussed the results, Dx and Tx plan with the patient who expressed understanding and agree(s) with the plan. Discharge instructions discussed at great length. The patient was given strict return precautions who verbalized understanding of the instructions. No further questions at time of discharge.    New Prescriptions   No medications on file    Follow Up: Elijio MilesJohn W. Weaver, MD [address] [place] [id number] [phone]  Schedule an appointment as soon as possible for a visit  in 3-5 days, If symptoms do not improve or  worsen   I personally performed the services described in this documentation, which was scribed in my presence. The recorded information has been reviewed and is accurate.        Nira ConnPedro Eduardo Cardama, MD 07/22/16 2002

## 2016-07-22 NOTE — ED Notes (Signed)
Pt. In no distress speaking full sentences

## 2016-07-22 NOTE — ED Triage Notes (Signed)
C/o right sided c/p intermittant x 2 days. Feels SOB. Hx of asthma. Throat has felt tight at times.

## 2016-08-06 ENCOUNTER — Ambulatory Visit
Admission: RE | Admit: 2016-08-06 | Discharge: 2016-08-06 | Disposition: A | Discharge status: Home / Self Care | Payer: BC Managed Care – PPO | Source: Ambulatory Visit | Attending: Family Medicine | Admitting: Family Medicine

## 2016-08-06 ENCOUNTER — Ambulatory Visit: Payer: BC Managed Care – PPO

## 2016-08-06 DIAGNOSIS — Z1231 Encounter for screening mammogram for malignant neoplasm of breast: Secondary | ICD-10-CM

## 2017-06-27 ENCOUNTER — Other Ambulatory Visit: Payer: Self-pay | Admitting: Family Medicine

## 2017-06-27 DIAGNOSIS — Z139 Encounter for screening, unspecified: Secondary | ICD-10-CM

## 2017-08-07 ENCOUNTER — Ambulatory Visit: Payer: BC Managed Care – PPO

## 2017-08-07 ENCOUNTER — Ambulatory Visit
Admission: RE | Admit: 2017-08-07 | Discharge: 2017-08-07 | Disposition: A | Discharge status: Home / Self Care | Payer: BC Managed Care – PPO | Source: Ambulatory Visit | Attending: Family Medicine | Admitting: Family Medicine

## 2017-08-07 DIAGNOSIS — Z139 Encounter for screening, unspecified: Secondary | ICD-10-CM

## 2021-04-23 ENCOUNTER — Encounter (HOSPITAL_BASED_OUTPATIENT_CLINIC_OR_DEPARTMENT_OTHER): Payer: Self-pay | Admitting: *Deleted

## 2021-04-23 ENCOUNTER — Emergency Department (HOSPITAL_BASED_OUTPATIENT_CLINIC_OR_DEPARTMENT_OTHER): Payer: BC Managed Care – PPO

## 2021-04-23 ENCOUNTER — Other Ambulatory Visit: Payer: Self-pay

## 2021-04-23 ENCOUNTER — Emergency Department (HOSPITAL_BASED_OUTPATIENT_CLINIC_OR_DEPARTMENT_OTHER)
Admission: EM | Admit: 2021-04-23 | Discharge: 2021-04-24 | Disposition: A | Discharge status: Home / Self Care | Payer: BC Managed Care – PPO | Attending: Emergency Medicine | Admitting: Emergency Medicine

## 2021-04-23 DIAGNOSIS — Y92410 Unspecified street and highway as the place of occurrence of the external cause: Secondary | ICD-10-CM | POA: Diagnosis not present

## 2021-04-23 DIAGNOSIS — J45909 Unspecified asthma, uncomplicated: Secondary | ICD-10-CM | POA: Insufficient documentation

## 2021-04-23 DIAGNOSIS — Z7951 Long term (current) use of inhaled steroids: Secondary | ICD-10-CM | POA: Diagnosis not present

## 2021-04-23 DIAGNOSIS — S0990XA Unspecified injury of head, initial encounter: Secondary | ICD-10-CM | POA: Diagnosis present

## 2021-04-23 DIAGNOSIS — I10 Essential (primary) hypertension: Secondary | ICD-10-CM | POA: Insufficient documentation

## 2021-04-23 DIAGNOSIS — M542 Cervicalgia: Secondary | ICD-10-CM | POA: Insufficient documentation

## 2021-04-23 DIAGNOSIS — M545 Low back pain, unspecified: Secondary | ICD-10-CM | POA: Diagnosis not present

## 2021-04-23 DIAGNOSIS — R0789 Other chest pain: Secondary | ICD-10-CM | POA: Insufficient documentation

## 2021-04-23 DIAGNOSIS — M549 Dorsalgia, unspecified: Secondary | ICD-10-CM

## 2021-04-23 DIAGNOSIS — M546 Pain in thoracic spine: Secondary | ICD-10-CM | POA: Insufficient documentation

## 2021-04-23 LAB — URINALYSIS, ROUTINE W REFLEX MICROSCOPIC
Bilirubin Urine: NEGATIVE
Glucose, UA: NEGATIVE mg/dL
Hgb urine dipstick: NEGATIVE
Ketones, ur: NEGATIVE mg/dL
Nitrite: NEGATIVE
Protein, ur: NEGATIVE mg/dL
Specific Gravity, Urine: 1.015 (ref 1.005–1.030)
pH: 6.5 (ref 5.0–8.0)

## 2021-04-23 LAB — URINALYSIS, MICROSCOPIC (REFLEX)

## 2021-04-23 LAB — PREGNANCY, URINE: Preg Test, Ur: NEGATIVE

## 2021-04-23 MED ORDER — ACETAMINOPHEN 500 MG PO TABS
1000.0000 mg | ORAL_TABLET | Freq: Once | ORAL | Status: AC
  Administered 2021-04-23: 1000 mg via ORAL
  Filled 2021-04-23: qty 2
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
## 2021-04-24 MED ORDER — METHOCARBAMOL 500 MG PO TABS
500.0000 mg | ORAL_TABLET | Freq: Once | ORAL | Status: AC
  Administered 2021-04-24: 500 mg via ORAL
  Filled 2021-04-24: qty 1

## 2021-04-24 MED ORDER — KETOROLAC TROMETHAMINE 30 MG/ML IJ SOLN
30.0000 mg | Freq: Once | INTRAMUSCULAR | Status: AC
  Administered 2021-04-24: 30 mg via INTRAMUSCULAR
  Filled 2021-04-24: qty 1
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
## 2022-06-19 ENCOUNTER — Ambulatory Visit
Admission: RE | Admit: 2022-06-19 | Discharge: 2022-06-19 | Disposition: A | Discharge status: Home / Self Care | Payer: BC Managed Care – PPO | Source: Ambulatory Visit | Attending: Allergy and Immunology | Admitting: Allergy and Immunology

## 2022-06-19 ENCOUNTER — Other Ambulatory Visit: Payer: Self-pay | Admitting: Allergy and Immunology

## 2022-06-19 DIAGNOSIS — J4551 Severe persistent asthma with (acute) exacerbation: Secondary | ICD-10-CM

## 2022-07-12 ENCOUNTER — Ambulatory Visit: Payer: BC Managed Care – PPO | Admitting: Internal Medicine

## 2022-07-12 ENCOUNTER — Encounter: Payer: Self-pay | Admitting: Internal Medicine

## 2022-07-12 VITALS — BP 124/70 | HR 105 | Ht 62.0 in | Wt 185.4 lb

## 2022-07-12 DIAGNOSIS — J4550 Severe persistent asthma, uncomplicated: Secondary | ICD-10-CM

## 2022-07-12 DIAGNOSIS — J45909 Unspecified asthma, uncomplicated: Secondary | ICD-10-CM | POA: Diagnosis not present

## 2022-07-12 DIAGNOSIS — Z7952 Long term (current) use of systemic steroids: Secondary | ICD-10-CM | POA: Diagnosis not present

## 2022-07-12 MED ORDER — OXYCODONE HCL 5 MG PO TABS: 5.0000 mg | ORAL_TABLET | ORAL | 0 refills | Status: DC | PRN

## 2022-07-12 NOTE — Patient Instructions (Addendum)
Take delsym two tsp every 12 hours and supplement if needed with  oxycodone 5 mg up to 1-2 every 4 hours to suppress the urge to cough. Swallowing water and/or using ice chips/non mint and menthol containing candies (such as lifesavers or sugarless jolly ranchers) are also effective.  You should rest your voice and avoid activities that you know make you cough.  Once you have eliminated the cough for 3 straight days try reducing the oxycodone first,  then the delsym as tolerated.    Taper prednisone as plan down to 5 mg daily and leave it there.  GERD (REFLUX)  is an extremely common cause of respiratory symptoms just like yours , many times with no obvious heartburn at all.    It can be treated with medication, but also with lifestyle changes including elevation of the head of your bed (ideally with 6-8inch blocks under the headboard of your bed),  Smoking cessation, avoidance of late meals, excessive alcohol, and avoid fatty foods, chocolate, peppermint, colas, red wine, and acidic juices such as orange juice.  NO MINT OR MENTHOL PRODUCTS SO NO COUGH DROPS  USE SUGARLESS CANDY INSTEAD (Jolley ranchers or Stover's or Life Savers) or even ice chips will also do - the key is to swallow to prevent all throat clearing. NO OIL BASED VITAMINS - use powdered substitutes.  Avoid fish oil when coughing.   Please schedule a follow up office visit in 4 weeks, sooner if needed with inhalers and spacer

## 2022-07-12 NOTE — Progress Notes (Signed)
Subjective:    Patient ID: [name], female    DOB: 1971-01-06, 52 y.o.   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 She c/o increased cough, chest tightness and SOB for the past 3-4 wks. Cough is non prod. She is SOB with or without any exertion. Using rescue inhaler approx 4 x per day.   Still on shots per [facility] plus singulair plus symbicort 160 and prn ppi  Caught a cold with sore throat then cough about one month prior to OV   > then tightness > rx zpak / pred  and omeprazole 20 ac.  Cough and sob tend to peak with cold air and activity and yet not  While sleeping, proair every 6 hours on avg whereas prior to onset not needing saba at all rec Continue symbicort 160 Take 2 puffs first thing in am and then another 2 puffs about 12 hours later.  Work on inhaler technique:  Only  use your albuterol as a rescue medication to be used if you can't catch your breath  Omeprazole 20 mg take x 2 both before bfast and supper and add chlortrimeton 4 mg at bedtime Prednisone 10 mg take  4 each am x 2 days,   2 each am x 2 days,  1 each am x 2 days and stop  Take delsym two tsp every 12 hours and supplement if needed with oxycodone up to 2 every 4 hours    GERD diet  09/01/13 Dr Lamonte Sakai eval for acute exac rx pred/doxy   10/18/2013 f/u ov/Ciaran Begay re: chronic asthma on symbicort 160 /singulair/ shots per Harold Hedge  Chief Complaint  Patient presents with   Follow-up    Pt states overall doing well.  She is using rescue inhaler 1-2 times per wk on average. She states that since her last visit, she has noticed that she gets SOB when she lies completely flat and has to sleep  propped up.    has to prop up within a few mins, no change p saba, needs saba daytime walking up hill to get to work. Rec Add Pepcid ac 20 mg at bedtime GERD diet reviewed, bed blocks   Return if breathing worse on needing more than twice weekly rescue inhaler.  05/15/22 maint on symb 160/spiriva change to Breo from symbicort due to insurance (did not know about generic option) then acutely worse 05/2016   07/12/2022  Acute ov/[place] office/Florean Hoobler re: cough/sob  maint on Symbicort 160  and Tezpire  nexium 40 mg bid ac  Chief Complaint  Patient presents with   Consult    Consult  Cough    Dyspnea:  slower pace than usual / one flight of steps all she can do and catch breath at top  Goals is to walk trails and last done so Oct 2023 at 175 on prednisone 5 mg  x years  Cough: severe dry 24/7  Sleeping: level bed 2 pillows  SABA use: every 4 hours  02: none  Covid status: vax x 4 Midline cp x 3 weeks with coughing fits and deep breath/ also  freq chest tightness  better with nebs vs hfa    No obvious day to day or daytime variability or assoc excess/ purulent sputum or mucus plugs or hemoptysis or   subjective wheeze or overt sinus or hb symptoms.     Also denies any obvious fluctuation of symptoms with weather or environmental changes or other aggravating or alleviating factors except as outlined above   No unusual exposure hx or h/o childhood pna/ asthma or knowledge of premature birth.  Current Allergies, Complete Past Medical History, Past Surgical History, Family History, and Social History were reviewed in [employer] record.  ROS  The following are not active complaints unless bolded Hoarseness, sore throat, dysphagia, dental problems, itching, sneezing,  nasal congestion or discharge of excess mucus or purulent secretions, ear ache,   fever, chills, sweats, unintended wt loss or wt gain, classically pleuritic or exertional cp,  orthopnea pnd or arm/hand  swelling  or leg swelling, presyncope, palpitations, abdominal pain, anorexia, nausea, vomiting, diarrhea  or change in bowel habits or change in bladder habits, change in stools or change in urine, dysuria, hematuria,  rash, arthralgias, visual complaints, headache, numbness, weakness or ataxia or problems with walking or coordination,  change in mood or  memory.        Current Meds  Medication Sig   albuterol (VENTOLIN HFA) 108 (90 Base) MCG/ACT inhaler Inhale 2 puffs into the lungs every 6 (six) hours as needed for wheezing or shortness of breath.   amLODipine (NORVASC) 5 MG tablet Take by mouth.   ARIPiprazole (ABILIFY) 2 MG tablet Take 3 mg by mouth daily.   budesonide-formoterol (SYMBICORT) 160-4.5 MCG/ACT inhaler Inhale 2 puffs into the lungs 2 (two) times daily.   buPROPion (WELLBUTRIN XL) 150 MG 24 hr tablet Take 150 mg by mouth daily.   Cholecalciferol 50 MCG (2000 UT) CAPS Take 1 capsule by mouth daily.   cyproheptadine (PERIACTIN) 4 MG tablet Take 4 mg by mouth 2 (two) times daily.   DULoxetine (CYMBALTA) 60 MG capsule    esomeprazole (NEXIUM) 40 MG capsule Take by mouth.   fexofenadine (ALLEGRA) 180 MG tablet Take 180 mg by mouth daily.   fluticasone (FLONASE) 50 MCG/ACT nasal spray Place 2 sprays into both nostrils daily.   montelukast (SINGULAIR) 10 MG tablet Take 10 mg by mouth at bedtime.   predniSONE (DELTASONE) 5 MG tablet Take 1 tablet by mouth daily.   Pseudoephedrine HCl (SUDAFED 12 HOUR PO) Take 1 tablet by mouth 2 (two) times daily.   TEZSPIRE 210 MG/1.91ML syringe    tiotropium (SPIRIVA) 18 MCG inhalation capsule Place 18 mcg into inhaler and inhale daily.   Topiramate ER (TROKENDI XR) 100 MG CP24 Take 1 capsule by mouth daily.   valACYclovir (VALTREX) 1000 MG tablet    zolpidem (AMBIEN) 5 MG tablet Take 5 mg by mouth at bedtime as needed for sleep.                Past Medical History:  EXTRINSIC ASTHMA, UNSPECIFIED (ICD-493.00)  - Allergy shots per Dr Bernita Buffy / [facility] - HFA 50% August 09, 2008  Depression  GERD    Family History:  allergies in mother  hypertension in mother and father    Social History:  Patient never smoked.  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 No bruits or organomegaly appreciated   MS:  Nl gait/ ext warm without deformities Or obvious joint restrictions  calf tenderness, cyanosis or clubbing    SKIN: warm and dry without lesions    NEURO:  alert, approp, nl sensorium with  no motor or cerebellar deficits apparent.       I personally reviewed images and agree with radiology impression as follows:  CXR:   pa and lateral 07/04/22  No active dz   CT July 29 2017  No pneumothorax. No pleural effusion. No acute consolidative airspace disease or lung masses. Posterior left upper lobe 3 mm solid pulmonary nodule (series 3/image 32). Peripheral subpleural 4 mm left lower lobe solid pulmonary nodule (series 3/image 82). Minimal cylindrical bronchiectasis in the medial segment right middle lobe and medial lingula with associated minimal parenchymal banding.     Assessment & Plan:

## 2022-07-12 NOTE — Assessment & Plan Note (Addendum)
Onset as child grew out 5th grade and then worse since age 52 (2001) with poor control esp since 2008  - Spirometry 10/18/13 wnl including fef 25-75  - allergy shots d/c 09/2017  - daily prednisone since ? 2019  Kathrin Greathouse 06/30/2020  last rx 06/24/22  - 07/12/2022  After extensive coaching inhaler device,  effectiveness =    75% (variable insp flows)   DDX of  difficult airways management almost all start with A and  include Adherence, Ace Inhibitors, Acid Reflux, Active Sinus Disease, Alpha 1 Antitripsin deficiency, Anxiety masquerading as Airways dz,  ABPA,  Allergy(esp in young), Aspiration (esp in elderly), Adverse effects of meds,  Active smoking or vaping, A bunch of PE's (a small clot burden can't cause this syndrome unless there is already severe underlying pulm or vascular dz with poor reserve) plus two Bs  = Bronchiectasis and Beta blocker use..and one C= CHF  Adherence is always the initial "prime suspect" and is a multilayered concern that requires a "trust but verify" approach in every patient - starting with knowing how to use medications, especially inhalers, correctly, keeping up with refills and understanding the fundamental difference between maintenance and prns vs those medications only taken for a very short course and then stopped and not refilled.  - see hfa teaching/ ok to use spacer but return to validate it's effective   ? Acid (or non-acid) GERD > always difficult to exclude as up to 75% of pts in some series report no assoc GI/ Heartburn symptoms> rec max (24h)  acid suppression and diet restrictions/ reviewed and instructions given in writing.  - Of the three most common causes of  Sub-acute / recurrent or chronic cough, only one (GERD)  can actually contribute to/ trigger  the other two (asthma and post nasal drip syndrome)  and perpetuate the cylce of cough.  While not intuitively obvious, many patients with chronic low grade reflux do not cough until there is a primary insult  that disturbs the protective epithelial barrier and exposes sensitive nerve endings.   This is typically viral but can due to PNDS and  either may apply here.   The point is that once this occurs, it is difficult to eliminate the cycle  using anything but a maximally effective acid suppression regimen at least in the short run, accompanied by an appropriate diet to address non acid GERD and control / eliminate the cough itself for at least 3 days with delsym and oxycodone and use of hard rock candy  ? Allergy > FENO 07/12/2022 13 so between the tezpire, singulair, high dose ics and Pred I believe the Th2 component has been eliminated and can start to shorten her list at next ov -Re SABA :  I spent extra time with pt today reviewing appropriate use of albuterol for prn use on exertion with the following points: 1) saba is for relief of sob that does not improve by walking a slower pace or resting but rather if the pt does not improve after trying this first. 2) If the pt is convinced, as many are, that saba helps recover from activity faster then it's easy to tell if this is the case by re-challenging : ie stop, take the inhaler, then p 5 minutes try the exact same activity (intensity of workload) that just caused the symptoms and see if they are substantially diminished or not after saba 3) if there is an activity that reproducibly causes the symptoms, try the saba  15 min before the activity on alternate days   If in fact the saba really does help, then fine to continue to use it prn but advised may need to look closer at the maintenance regimen being used to achieve better control of airways disease with exertion.    ? Active sinus dz > really no rhinitis at  present   ? Adverse effects of DPI > eliminate these indefinitely.

## 2022-07-19 ENCOUNTER — Telehealth: Payer: Self-pay | Admitting: *Deleted

## 2022-07-19 NOTE — Telephone Encounter (Signed)
Pt returned call ... Dr.Wert, pt reports she had tapered down her usage of the oxycodone you had given her @ LOV. Finally on 3/6 she stopped altogether but the cough (which woke her up) returned last night. She is curious if it is okay to begin taking it every 12hrs regularly or if there is a certain point she needs to stop using the medication. She reports she has 12 tablets left and if you are okay with her continuing she needs a refill.   Please advise.

## 2022-07-19 NOTE — Telephone Encounter (Signed)
ATC pt LVM for her to call office back so I can gather more information.

## 2022-07-19 NOTE — Telephone Encounter (Signed)
I'm sorry it didn't work out the way I intended: The goals is to 100% stop even urge to cough x 3 straight days then stop the narcotic completely - weaning it to the lowest dose where cough is "tolerable" is a good way to develop dependency so that's why the AVS was written the way it was  If not 100% better needs to set up f/u with all meds in hand and regroup

## 2022-07-22 ENCOUNTER — Encounter: Payer: Self-pay | Admitting: Internal Medicine

## 2022-07-22 NOTE — Telephone Encounter (Signed)
Will need ov with all active meds in hand as listed in last phone note  Can come to [facility] is nothing available in [place]

## 2022-07-22 NOTE — Telephone Encounter (Signed)
Atc pt, no answer left voicemail for pt to call the office back

## 2022-07-22 NOTE — Telephone Encounter (Signed)
Dr Melvyn Novas,    Hello, I have attempted to reach out to you last Friday and today via your office due to cough increase after attempting to wean down the cough medicine and stop oxycodone also on Thursday of last week. On Wednesday I was on only delsym and felt fine prednisone decreased to 20  mg that day. Thursday cough increase restart oxy at taking every 12 hr and Friday cough much worse into weekend to today. I then restart oxy at every 4-6 hrs like before. I want to ask is that what I was supposed to do?? The cough was also getting worse at night.  I am running out of oxy. I was also getting afraid I was taking it too long.  What should I do?   Alexis Hall

## 2022-07-22 NOTE — Telephone Encounter (Signed)
Talked to pt / will see 07/26/22 as add on if not better on 1st gen H1 blockers per guidelines

## 2022-07-23 NOTE — Telephone Encounter (Signed)
Will send to [place] as FYI just in case pt gets added on.

## 2022-07-30 ENCOUNTER — Ambulatory Visit: Payer: BC Managed Care – PPO | Admitting: Internal Medicine

## 2022-07-30 ENCOUNTER — Encounter: Payer: Self-pay | Admitting: Internal Medicine

## 2022-07-30 VITALS — BP 126/76 | HR 88 | Temp 98.4°F | Ht 62.0 in | Wt 186.4 lb

## 2022-07-30 DIAGNOSIS — Z7952 Long term (current) use of systemic steroids: Secondary | ICD-10-CM | POA: Diagnosis not present

## 2022-07-30 DIAGNOSIS — J4550 Severe persistent asthma, uncomplicated: Secondary | ICD-10-CM

## 2022-07-30 MED ORDER — AIRSUPRA 90-80 MCG/ACT IN AERO: 2.0000 | INHALATION_SPRAY | Freq: Four times a day (QID) | RESPIRATORY_TRACT | 11 refills | Status: DC | PRN

## 2022-07-30 NOTE — Progress Notes (Signed)
Subjective:    Patient ID: Alexis Hall, female    DOB: 09/25/1970, 52 y.o.   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 She c/o increased cough, chest tightness and SOB for the past 3-4 wks. Cough is non prod. She is SOB with or without any exertion. Using rescue inhaler approx 4 x per day.   Still on shots per Alexis Hall plus singulair plus symbicort 160 and prn ppi  Caught a cold with sore throat then cough about one month prior to OV   > then tightness > rx zpak / pred  and omeprazole 20 ac.  Cough and sob tend to peak with cold air and activity and yet not  While sleeping, proair every 6 hours on avg whereas prior to onset not needing saba at all rec Continue symbicort 160 Take 2 puffs first thing in am and then another 2 puffs about 12 hours later.  Work on inhaler technique:  Only  use your albuterol as a rescue medication to be used if you can't catch your breath  Omeprazole 20 mg take x 2 both before bfast and supper and add chlortrimeton 4 mg at bedtime Prednisone 10 mg take  4 each am x 2 days,   2 each am x 2 days,  1 each am x 2 days and stop  Take delsym two tsp every 12 hours and supplement if needed with oxycodone up to 2 every 4 hours    GERD diet  09/01/13 Dr Alexis Hall eval for acute exac rx pred/doxy   10/18/2013 f/u ov/Alexis Hall re: chronic asthma on symbicort 160 /singulair/ shots per Alexis Hall  Chief Complaint  Patient presents with   Follow-up    Pt states overall doing well.  She is using rescue inhaler 1-2 times per wk on average. She states that since her last visit, she has noticed that she gets SOB when she lies completely flat and  has to sleep propped up.    has to prop up within a few mins, no change p saba, needs saba daytime walking up hill to get to work. Rec Add Pepcid ac 20 mg at bedtime GERD diet reviewed, bed blocks   Return if breathing worse on needing more than twice weekly rescue inhaler.  05/15/22 maint on symb 160/spiriva change to Breo from symbicort due to insurance (did not know about generic option) then acutely worse 05/2016   07/12/2022  Acute ov/Alexis Hall office/Alexis Hall re: cough/sob  maint on Symbicort 160  and Tezpire  nexium 40 mg bid ac  Chief Complaint  Patient presents with   Consult    Consult  Cough    Dyspnea:  slower pace than usual / one flight of steps all she can do and catch breath at top  Goals is to walk trails and last done so Oct 2023 at 175 on prednisone 5 mg  x years  Cough: severe dry 24/7  Sleeping: level bed 2 pillows  SABA use: every 4 hours  02: none  Covid status: vax x 4 Midline cp x 3 weeks with coughing fits and deep  breath/ also freq chest tightness  better with nebs vs hfa  Rec Take delsym two tsp every 12 hours and supplement if needed with  oxycodone 5 mg up to 1-2 every 4 hours to suppress the urge to cough.  Once you have eliminated the cough for 3 straight days try reducing the oxycodone first,  then the delsym as tolerated.   Taper prednisone as plan down to 5 mg daily and leave it there. GERD diet reviewed, bed blocks rec   Please schedule a follow up office visit in 4 weeks, sooner if needed with inhalers and spacer     07/30/2022  f/u ov/Alexis Hall re: asthma/ cough  maint on tezpire symbicort 160/spiriva 1.25  and pred still at 10 mg (2 years0 Chief Complaint  Patient presents with   Follow-up    Cough much improved.  Chlorpheniramine is helping a lot.     Dyspnea:  not very active  Cough: much better no narcs  Sleeping: level bed 2 pillows  2 hs / feels good in am  SABA use: avg 1-2 x daily      No obvious day to day or daytime variability or assoc  excess/ purulent sputum or mucus plugs or hemoptysis or cp or chest tightness, subjective wheeze or overt sinus or hb symptoms.   Sleeping now without nocturnal  or early am exacerbation  of respiratory  c/o's or need for noct saba. Also denies any obvious fluctuation of symptoms with weather or environmental changes or other aggravating or alleviating factors except as outlined above   No unusual exposure hx or h/o childhood pna  or knowledge of premature birth.  Current Allergies, Complete Past Medical History, Past Surgical History, Family History, and Social History were reviewed in [employer] record.  ROS  The following are not active complaints unless bolded Hoarseness, sore throat, dysphagia, dental problems, itching, sneezing,  nasal congestion or discharge of excess mucus or purulent secretions, ear ache,   fever, chills, sweats, unintended wt loss or wt gain, classically pleuritic or exertional cp,  orthopnea pnd or arm/hand swelling  or leg swelling, presyncope, palpitations, abdominal pain, anorexia, nausea, vomiting, diarrhea  or change in bowel habits or change in bladder habits, change in stools or change in urine, dysuria, hematuria,  rash, arthralgias, visual complaints, headache, numbness, weakness or ataxia or problems with walking or coordination,  change in mood or  memory.        Current Meds  Medication Sig   albuterol (VENTOLIN HFA) 108 (90 Base) MCG/ACT inhaler Inhale 2 puffs into the lungs every 6 (six) hours as needed for wheezing or shortness of breath.   amLODipine (NORVASC) 5 MG tablet Take by mouth.   ARIPiprazole (ABILIFY) 2 MG tablet Take 3 mg by mouth daily.   budesonide-formoterol (SYMBICORT) 160-4.5 MCG/ACT inhaler Inhale 2 puffs into the lungs 2 (two) times daily.   buPROPion (WELLBUTRIN XL) 150 MG 24 hr tablet Take 150 mg by mouth daily.   Cholecalciferol 50 MCG (2000 UT) CAPS Take 1 capsule by mouth daily.   cyproheptadine (PERIACTIN)  4 MG tablet Take 4 mg by mouth 2 (two) times daily.   DULoxetine (CYMBALTA) 60 MG capsule    esomeprazole (NEXIUM) 40 MG capsule Take by mouth.   fluticasone (FLONASE) 50 MCG/ACT nasal spray Place 2 sprays into both nostrils daily.   montelukast (SINGULAIR) 10 MG tablet Take 10 mg by mouth at bedtime.   predniSONE (DELTASONE) 5 MG tablet Take 1 tablet by  mouth daily.   TEZSPIRE 210 MG/1.91ML syringe    tiotropium (SPIRIVA) 18 MCG inhalation capsule Place 18 mcg into inhaler and inhale daily.   Topiramate ER (TROKENDI XR) 100 MG CP24 Take 1 capsule by mouth daily.   valACYclovir (VALTREX) 1000 MG tablet    zolpidem (AMBIEN) 5 MG tablet Take 5 mg by mouth at bedtime as needed for sleep.               Past Medical History:  EXTRINSIC ASTHMA, UNSPECIFIED (ICD-493.00)  - Allergy shots per Dr Bernita Buffy / Alexis Hall - HFA 50% August 09, 2008  Depression  GERD    Family History:  allergies in mother  hypertension in mother and father    Social History:  Patient never smoked.  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 No bruits or organomegaly appreciated   MS:  Nl gait/ ext warm without deformities Or obvious joint restrictions  calf tenderness, cyanosis or clubbing    SKIN: warm and dry without lesions    NEURO:  alert, approp, nl sensorium with  no motor or cerebellar deficits apparent.          Assessment & Plan:

## 2022-07-30 NOTE — Patient Instructions (Signed)
Change albuterol to airsupra up to 2 puffs every 4hours as needed   Pulmonary follow up is as needed

## 2022-07-30 NOTE — Assessment & Plan Note (Signed)
Onset as child grew out 5th grade    - then worse since age 52 (2001) with poor control esp since 2008  - Spirometry 10/18/13 wnl including fef 25-75  - allergy shots d/c 09/2017  - daily prednisone since ? 2019  Alexis Hall 06/30/2020  last rx 06/24/22  - 07/12/2022  After extensive coaching inhaler device,  effectiveness =    75% (variable insp flows)  FENO 07/12/2022   = 12  on pred 30 mg daily and symb 160 and singulair  - 07/30/2022  After extensive coaching inhaler device,  effectiveness =   90%  with hfa with / without spacer  Will change albuterol to airsupra to see if pred rx and need for saba decrease and f/u with Allergy as planned, here prn      Each maintenance medication was reviewed in detail including emphasizing most importantly the difference between maintenance and prns and under what circumstances the prns are to be triggered using an action plan format where appropriate.  Total time for H and P, chart review, counseling, reviewing hfa/spacer  device(s) and generating customized AVS unique to this office visit / same day charting = 20 min

## 2022-07-31 ENCOUNTER — Institutional Professional Consult (permissible substitution): Payer: BC Managed Care – PPO | Admitting: Internal Medicine

## 2022-10-08 ENCOUNTER — Encounter: Payer: Self-pay | Admitting: Internal Medicine

## 2022-10-09 ENCOUNTER — Other Ambulatory Visit: Payer: Self-pay

## 2022-10-09 DIAGNOSIS — J4550 Severe persistent asthma, uncomplicated: Secondary | ICD-10-CM

## 2022-10-09 NOTE — Telephone Encounter (Signed)
Just sent teams message to [place] - new order can't be seen.  Dr Sherene Sires requested referral to [facility].

## 2022-10-09 NOTE — Telephone Encounter (Signed)
Referral was placed for Dr Irena Cords at [facility].  Dr Irena Cords is not in that office - he is with [facility].  Per note Dr Irena Cords is wanting pt to be seen at [facility].  Dr Sherene Sires has agreed.  Referral needs to be placed for [facility].  Will route back to triage for order to be corrected.

## 2022-10-28 ENCOUNTER — Encounter (HOSPITAL_BASED_OUTPATIENT_CLINIC_OR_DEPARTMENT_OTHER): Payer: Self-pay

## 2022-10-28 ENCOUNTER — Emergency Department (HOSPITAL_BASED_OUTPATIENT_CLINIC_OR_DEPARTMENT_OTHER): Payer: BC Managed Care – PPO

## 2022-10-28 ENCOUNTER — Emergency Department (HOSPITAL_BASED_OUTPATIENT_CLINIC_OR_DEPARTMENT_OTHER)
Admission: EM | Admit: 2022-10-28 | Discharge: 2022-10-28 | Disposition: A | Discharge status: Home / Self Care | Payer: BC Managed Care – PPO | Attending: Emergency Medicine | Admitting: Emergency Medicine

## 2022-10-28 DIAGNOSIS — R519 Headache, unspecified: Secondary | ICD-10-CM | POA: Diagnosis present

## 2022-10-28 DIAGNOSIS — G43809 Other migraine, not intractable, without status migrainosus: Secondary | ICD-10-CM | POA: Diagnosis not present

## 2022-10-28 LAB — BASIC METABOLIC PANEL
Anion gap: 8 (ref 5–15)
BUN: 13 mg/dL (ref 6–20)
CO2: 21 mmol/L — ABNORMAL LOW (ref 22–32)
Calcium: 9.3 mg/dL (ref 8.9–10.3)
Chloride: 110 mmol/L (ref 98–111)
Creatinine, Ser: 0.74 mg/dL (ref 0.44–1.00)
GFR, Estimated: >60 mL/min (ref >60)
Glucose, Bld: 127 mg/dL — ABNORMAL HIGH (ref 70–99)
Potassium: 3.8 mmol/L (ref 3.5–5.1)
Sodium: 139 mmol/L (ref 135–145)

## 2022-10-28 LAB — CBC WITH DIFFERENTIAL/PLATELET
Abs Immature Granulocytes: 0.01 10*3/uL (ref 0.00–0.07)
Basophils Absolute: 0 10*3/uL (ref 0.0–0.1)
Basophils Relative: 0 %
Eosinophils Absolute: 0 10*3/uL (ref 0.0–0.5)
Eosinophils Relative: 0 %
HCT: 38.7 % (ref 36.0–46.0)
Hemoglobin: 12.6 g/dL (ref 12.0–15.0)
Immature Granulocytes: 0 %
Lymphocytes Relative: 38 %
Lymphs Abs: 1.9 10*3/uL (ref 0.7–4.0)
MCH: 31.4 pg (ref 26.0–34.0)
MCHC: 32.6 g/dL (ref 30.0–36.0)
MCV: 96.5 fL (ref 80.0–100.0)
Monocytes Absolute: 0.4 10*3/uL (ref 0.1–1.0)
Monocytes Relative: 7 %
Neutro Abs: 2.7 10*3/uL (ref 1.7–7.7)
Neutrophils Relative %: 55 %
Platelets: 202 10*3/uL (ref 150–400)
RBC: 4.01 MIL/uL (ref 3.87–5.11)
RDW: 12.9 % (ref 11.5–15.5)
WBC: 4.9 10*3/uL (ref 4.0–10.5)
nRBC: 0 % (ref 0.0–0.2)

## 2022-10-28 LAB — CBG MONITORING, ED: Glucose-Capillary: 126 mg/dL — ABNORMAL HIGH (ref 70–99)

## 2022-10-28 MED ORDER — DEXAMETHASONE SODIUM PHOSPHATE 10 MG/ML IJ SOLN
10.0000 mg | Freq: Once | INTRAMUSCULAR | Status: AC
  Administered 2022-10-28: 10 mg via INTRAVENOUS
  Filled 2022-10-28: qty 1

## 2022-10-28 MED ORDER — DIPHENHYDRAMINE HCL 50 MG/ML IJ SOLN
12.5000 mg | Freq: Once | INTRAMUSCULAR | Status: AC
  Administered 2022-10-28: 12.5 mg via INTRAVENOUS
  Filled 2022-10-28: qty 1

## 2022-10-28 MED ORDER — PROCHLORPERAZINE EDISYLATE 10 MG/2ML IJ SOLN
10.0000 mg | Freq: Once | INTRAMUSCULAR | Status: AC
  Administered 2022-10-28: 10 mg via INTRAVENOUS
  Filled 2022-10-28: qty 2

## 2022-10-28 MED ORDER — SODIUM CHLORIDE 0.9 % IV BOLUS
1000.0000 mL | Freq: Once | INTRAVENOUS | Status: AC
  Administered 2022-10-28: 1000 mL via INTRAVENOUS

## 2022-10-28 MED ORDER — KETOROLAC TROMETHAMINE 30 MG/ML IJ SOLN
30.0000 mg | Freq: Once | INTRAMUSCULAR | Status: AC
  Administered 2022-10-28: 30 mg via INTRAVENOUS
  Filled 2022-10-28: qty 1

## 2022-10-28 NOTE — ED Triage Notes (Signed)
Pt states she has been having tingling on her face and numbness on left side of face since 1715 today. Pt c/o headache and pain in left eye. Equal strength and grips in bilateral arms/legs.

## 2022-10-28 NOTE — ED Provider Notes (Signed)
[facility] Provider Note   CSN: [id number] Arrival date & time: 10/28/22  1835     History  Chief Complaint  Patient presents with   Numbness   Headache    Alexis Hall is a 52 y.o. female.  Patient here with migraine headache with tingling to left side of the face.  He has a history of tingling with migraines in the past with a little bit more facial involvement today.  She denies any weakness or vision changes.  She is on multiple medications for migraines and follows with neurology.  She denies any chest pain or shortness of breath or fever or chills.  Nothing makes it worse or better.  The history is provided by the patient.       Home Medications Prior to Admission medications   Medication Sig Start Date End Date Taking? Authorizing Provider  albuterol (VENTOLIN HFA) 108 (90 Base) MCG/ACT inhaler Inhale 2 puffs into the lungs every 6 (six) hours as needed for wheezing or shortness of breath.    [provider]  Albuterol-Budesonide (AIRSUPRA) 90-80 MCG/ACT AERO Inhale 2 puffs into the lungs 4 (four) times daily as needed. 07/30/22   Nyoka Cowden, MD  amLODipine (NORVASC) 5 MG tablet Take by mouth. 06/07/22   [provider]  ARIPiprazole (ABILIFY) 2 MG tablet Take 3 mg by mouth daily.    [provider]  budesonide-formoterol (SYMBICORT) 160-4.5 MCG/ACT inhaler Inhale 2 puffs into the lungs 2 (two) times daily.    [provider]  buPROPion (WELLBUTRIN XL) 150 MG 24 hr tablet Take 150 mg by mouth daily.    [provider]  Cholecalciferol 50 MCG (2000 UT) CAPS Take 1 capsule by mouth daily. 04/16/16   [provider]  cyproheptadine (PERIACTIN) 4 MG tablet Take 4 mg by mouth 2 (two) times daily.    [provider]  DULoxetine (CYMBALTA) 60 MG capsule  03/04/22   [provider]  esomeprazole (NEXIUM) 40 MG capsule Take by mouth. 03/01/22   [provider]  fluticasone (FLONASE) 50 MCG/ACT nasal spray Place 2 sprays into both nostrils daily.    [provider]  montelukast (SINGULAIR) 10 MG tablet Take 10 mg by mouth at bedtime.    [provider]  predniSONE (DELTASONE) 5 MG tablet Take 1 tablet by mouth daily. 03/19/21   [provider]  TEZSPIRE 210 MG/1. syringe  08/29/21   [provider]  tiotropium (SPIRIVA) 18 MCG inhalation capsule Place 18 mcg into inhaler and inhale daily.    [provider]  Topiramate ER (TROKENDI XR) 100 MG CP24 Take 1 capsule by mouth daily.    [provider]  valACYclovir (VALTREX) 1000 MG tablet  02/13/22   [provider]  zolpidem (AMBIEN) 5 MG tablet Take 5 mg by mouth at bedtime as needed for sleep.    [provider]      Allergies    Hydrocodone-acetaminophen, Lamictal [lamotrigine], and Fluconazole    Review of Systems   Review of Systems  Physical Exam Updated Vital Signs BP (!) 152/86   Pulse 62   Temp 97.8 F (36.6 C)   Resp (!) 29   Ht 5' 1.5" (1.562 m)   Wt 84.9 kg   LMP 07/23/2016 (Exact Date)   SpO2 98%   BMI 34.79 kg/m  Physical Exam Vitals and nursing note reviewed.  Constitutional:      General: She is not  in acute distress.    Appearance: She is well-developed.  HENT:     Head: Normocephalic and atraumatic.     Mouth/Throat:     Mouth: Mucous membranes are moist.  Eyes:     Extraocular Movements: Extraocular movements intact.     Conjunctiva/sclera: Conjunctivae normal.     Pupils: Pupils are equal, round, and reactive to light.  Cardiovascular:     Rate and Rhythm: Normal rate and regular rhythm.     Heart sounds: No murmur heard. Pulmonary:     Effort: Pulmonary effort is normal. No respiratory distress.     Breath sounds: Normal breath sounds.  Abdominal:     Palpations: Abdomen is soft.     Tenderness: There is no abdominal tenderness.  Musculoskeletal:        General: No  swelling.     Cervical back: Normal range of motion and neck supple.  Skin:    General: Skin is warm and dry.     Capillary Refill: Capillary refill takes less than 2 seconds.  Neurological:     Mental Status: She is alert and oriented to person, place, and time.     Comments: 5+ out of 5 strength throughout, normal sensation, no drift, normal finger-nose-finger, normal speech, normal visual fields  Psychiatric:        Mood and Affect: Mood normal.     ED Results / Procedures / Treatments   Labs (all labs ordered are listed, but only abnormal results are displayed) Labs Reviewed  BASIC METABOLIC PANEL - Abnormal; Notable for the following components:      Result Value   CO2 21 (*)    Glucose, Bld 127 (*)    All other components within normal limits  CBG MONITORING, ED - Abnormal; Notable for the following components:   Glucose-Capillary 126 (*)    All other components within normal limits  CBC WITH DIFFERENTIAL/PLATELET    EKG EKG Interpretation  Date/Time:  Monday October 28 2022 18:44:09 EDT Ventricular Rate:  74 PR Interval:  135 QRS Duration: 87 QT Interval:  402 QTC Calculation: 446 R Axis:   92 Text Interpretation: Sinus rhythm Borderline right axis deviation Confirmed by Virgina Norfolk ([id number]) on 10/28/2022 6:46:39 PM  Radiology CT Head Wo Contrast  Result Date: 10/28/2022 CLINICAL DATA:  Headache EXAM: CT HEAD WITHOUT CONTRAST TECHNIQUE: Contiguous axial images were obtained from the base of the skull through the vertex without intravenous contrast. RADIATION DOSE REDUCTION: This exam was performed according to the departmental dose-optimization program which includes automated exposure control, adjustment of the mA and/or kV according to patient size and/or use of iterative reconstruction technique. COMPARISON:  04/23/2021 FINDINGS: Brain: There is no mass, hemorrhage or extra-axial collection. The size and configuration of the ventricles and extra-axial CSF spaces are  normal. The brain parenchyma is normal, without acute or chronic infarction. Vascular: No abnormal hyperdensity of the major intracranial arteries or dural venous sinuses. No intracranial atherosclerosis. Skull: The visualized skull base, calvarium and extracranial soft tissues are normal. Sinuses/Orbits: No fluid levels or advanced mucosal thickening of the visualized paranasal sinuses. No mastoid or middle ear effusion. The orbits are normal. IMPRESSION: Normal head CT. Electronically Signed   By: Deatra Robinson M.D.   On: 10/28/2022 20:33    Procedures Procedures    Medications Ordered in ED Medications  ketorolac (TORADOL) 30 MG/ML injection 30 mg (has no administration in time range)  sodium chloride 0.9 % bolus 1,000 mL (0 mLs Intravenous Stopped 10/28/22  2156)  dexamethasone (DECADRON) injection 10 mg (10 mg Intravenous Given 10/28/22 2049)  prochlorperazine (COMPAZINE) injection 10 mg (10 mg Intravenous Given 10/28/22 2059)  diphenhydrAMINE (BENADRYL) injection 12.5 mg (12.5 mg Intravenous Given 10/28/22 2052)    ED Course/ Medical Decision Making/ A&P                             Medical Decision Making Amount and/or Complexity of Data Reviewed Labs: ordered. Radiology: ordered.  Risk Prescription drug management.   Starlin Panik is here with headache, tingling around the left side of her face.  Unremarkable vitals.  No fever.  History of migraines.  History of asthma.  Overall vital signs are reassuring.  Neurological exam is reassuring.  She is not having any weakness or vision changes or speech changes.  She appears to have some paresthesias on the left side of the face but when I touch it feels the same on both sides.  Overall I suspect complex migraine.  I have very little concern for stroke.  Will treat with headache cocktail with Compazine, Benadryl, Decadron and Toradol and get a head CT and basic labs.   Head CT per radiology reports unremarkable.  Lab work per my review and  interpretation with no significant anemia or electrolyte abnormality or kidney injury.  Headaches improving after headache cocktail.  Exam remained stable.  Have no concern for stroke at this time.  Suspect complex migraine we will have her follow-up with primary care doctor or neurology.  Discharged in good condition.        Final Clinical Impression(s) / ED Diagnoses Final diagnoses:  Other migraine without status migrainosus, not intractable    Rx / DC Orders ED Discharge Orders     None         Virgina Norfolk, DO 10/28/22 2201

## 2022-10-28 NOTE — ED Notes (Signed)
Past migraines, pt has had numbness around her mouth, but not the rest of her face.

## 2022-12-06 ENCOUNTER — Encounter: Payer: Self-pay | Admitting: Internal Medicine

## 2023-02-17 ENCOUNTER — Telehealth: Payer: Self-pay | Admitting: Internal Medicine

## 2023-02-17 NOTE — Telephone Encounter (Signed)
Lm x1 for patient.  

## 2023-02-17 NOTE — Telephone Encounter (Signed)
Patient states having symptoms of cough, wheezing, and shortness of breath. Pharmacy is [facility]. No available appointments this week. Patient phone number is [phone].

## 2023-02-18 ENCOUNTER — Encounter: Payer: Self-pay | Admitting: *Deleted

## 2023-02-18 NOTE — Telephone Encounter (Signed)
Tried calling the pt and still no answer  She needs to be evaluated for these symptoms- no openings here so she should seek emergent care  I am sending her a mychart msg with this information.

## 2023-05-16 NOTE — Telephone Encounter (Signed)
 Call returned . No answer left message with callback number.     Left message advising patient that I did receive her message and have placed an updated work status note at the front desk for her to pick up based on Dr. Anitra last office visit note.

## 2023-05-18 ENCOUNTER — Other Ambulatory Visit: Payer: Self-pay

## 2023-05-18 ENCOUNTER — Emergency Department (HOSPITAL_BASED_OUTPATIENT_CLINIC_OR_DEPARTMENT_OTHER)
Admission: EM | Admit: 2023-05-18 | Discharge: 2023-05-18 | Disposition: A | Discharge status: Home / Self Care | Payer: 59 | Attending: Emergency Medicine | Admitting: Emergency Medicine

## 2023-05-18 ENCOUNTER — Encounter (HOSPITAL_BASED_OUTPATIENT_CLINIC_OR_DEPARTMENT_OTHER): Payer: Self-pay | Admitting: Emergency Medicine

## 2023-05-18 ENCOUNTER — Emergency Department (HOSPITAL_BASED_OUTPATIENT_CLINIC_OR_DEPARTMENT_OTHER): Payer: 59

## 2023-05-18 DIAGNOSIS — K625 Hemorrhage of anus and rectum: Secondary | ICD-10-CM | POA: Insufficient documentation

## 2023-05-18 DIAGNOSIS — J45909 Unspecified asthma, uncomplicated: Secondary | ICD-10-CM | POA: Diagnosis not present

## 2023-05-18 DIAGNOSIS — I10 Essential (primary) hypertension: Secondary | ICD-10-CM | POA: Insufficient documentation

## 2023-05-18 DIAGNOSIS — R109 Unspecified abdominal pain: Secondary | ICD-10-CM | POA: Diagnosis not present

## 2023-05-18 DIAGNOSIS — Z7952 Long term (current) use of systemic steroids: Secondary | ICD-10-CM | POA: Insufficient documentation

## 2023-05-18 DIAGNOSIS — Z79899 Other long term (current) drug therapy: Secondary | ICD-10-CM | POA: Diagnosis not present

## 2023-05-18 LAB — CBC
HCT: 37.7 % (ref 36.0–46.0)
Hemoglobin: 12.3 g/dL (ref 12.0–15.0)
MCH: 31 pg (ref 26.0–34.0)
MCHC: 32.6 g/dL (ref 30.0–36.0)
MCV: 95 fL (ref 80.0–100.0)
Platelets: 223 10*3/uL (ref 150–400)
RBC: 3.97 MIL/uL (ref 3.87–5.11)
RDW: 13.2 % (ref 11.5–15.5)
WBC: 8.9 10*3/uL (ref 4.0–10.5)
nRBC: 0 % (ref 0.0–0.2)

## 2023-05-18 LAB — COMPREHENSIVE METABOLIC PANEL
ALT: 34 U/L (ref 0–44)
AST: 16 U/L (ref 15–41)
Albumin: 3.7 g/dL (ref 3.5–5.0)
Alkaline Phosphatase: 70 U/L (ref 38–126)
Anion gap: 8 (ref 5–15)
BUN: 20 mg/dL (ref 6–20)
CO2: 21 mmol/L — ABNORMAL LOW (ref 22–32)
Calcium: 9.1 mg/dL (ref 8.9–10.3)
Chloride: 108 mmol/L (ref 98–111)
Creatinine, Ser: 0.84 mg/dL (ref 0.44–1.00)
GFR, Estimated: >60 mL/min (ref >60)
Glucose, Bld: 98 mg/dL (ref 70–99)
Potassium: 3.6 mmol/L (ref 3.5–5.1)
Sodium: 137 mmol/L (ref 135–145)
Total Bilirubin: 0.3 mg/dL (ref 0.0–1.2)
Total Protein: 6.9 g/dL (ref 6.5–8.1)

## 2023-05-18 LAB — OCCULT BLOOD X 1 CARD TO LAB, STOOL: Fecal Occult Bld: POSITIVE — AB

## 2023-05-18 MED ORDER — HYDROCORTISONE (PERIANAL) 2.5 % EX CREA: 1.0000 | TOPICAL_CREAM | Freq: Two times a day (BID) | CUTANEOUS | 0 refills | Status: AC

## 2023-05-18 MED ORDER — IOHEXOL 350 MG/ML SOLN
100.0000 mL | Freq: Once | INTRAVENOUS | Status: AC | PRN
  Administered 2023-05-18: 100 mL via INTRAVENOUS

## 2023-05-18 MED ORDER — POLYETHYLENE GLYCOL 3350 17 G PO PACK: 17.0000 g | PACK | Freq: Every day | ORAL | 0 refills | Status: DC

## 2023-05-18 NOTE — ED Provider Notes (Signed)
 [place] [facility] Provider Note   CSN: [id number] Arrival date & time: 05/18/23  0143     History  Chief Complaint  Patient presents with   Rectal Bleeding    Sergio Hobart is a 53 y.o. female.  Patient with a history of asthma, hypertension and migraines here with rectal bleeding.  States she had a bowel movement yesterday that was normal in color and solid but she noticed some red around the stool and red with wiping.  Since then she has had several episodes of spontaneous rectal bleeding without bowel movements.  There is blood dripping from her anus when she urinates.  This happened 5 or 6 times.  There is bright red blood with wiping and red in the toilet bowl.  Some crampy lower abdominal pain worse on the left side.  No nausea or vomiting.  Does have chronic shortness of breath and is recently being treated for pneumonia.  States she finished a course of antibiotics and is currently on a prednisone  taper.  She is also taken meloxicam for her wrist sprain.  Reports colonoscopy last year that showed polyps and some internal hemorrhoids.  Did have EGD about 5 years ago that was reassuring by her report. Does not feel dizzy or lightheaded but has noticed some increased shortness of breath which she has attributed to her asthma. Was told in the past she has internal hemorrhoids. No blood thinner use. No chest pain.  No lightheadedness or dizziness.  The history is provided by the patient and the spouse.  Rectal Bleeding Associated symptoms: abdominal pain   Associated symptoms: no dizziness, no fever and no vomiting        Home Medications Prior to Admission medications   Medication Sig Start Date End Date Taking? Authorizing Provider  albuterol  (VENTOLIN  HFA) 108 (90 Base) MCG/ACT inhaler Inhale 2 puffs into the lungs every 6 (six) hours as needed for wheezing or shortness of breath.    [provider]  Albuterol -Budesonide  (AIRSUPRA ) 90-80 MCG/ACT AERO Inhale 2 puffs into the lungs 4 (four) times daily as needed. 07/30/22   Darlean Ozell NOVAK, MD  amLODipine (NORVASC) 5 MG tablet Take by mouth. 06/07/22   [provider]  ARIPiprazole  (ABILIFY ) 2 MG tablet Take 3 mg by mouth daily.    [provider]  budesonide-formoterol (SYMBICORT) 160-4.5 MCG/ACT inhaler Inhale 2 puffs into the lungs 2 (two) times daily.    [provider]  buPROPion  (WELLBUTRIN  XL) 150 MG 24 hr tablet Take 150 mg by mouth daily.    [provider]  Cholecalciferol 50 MCG (2000 UT) CAPS Take 1 capsule by mouth daily. 04/16/16   [provider]  cyproheptadine  (PERIACTIN ) 4 MG tablet Take 4 mg by mouth 2 (two) times daily.    [provider]  DULoxetine  (CYMBALTA ) 60 MG capsule  03/04/22   [provider]  esomeprazole (NEXIUM) 40 MG capsule Take by mouth. 03/01/22   [provider]  fluticasone  (FLONASE) 50 MCG/ACT nasal spray Place 2 sprays into both nostrils daily.    [provider]  montelukast  (SINGULAIR ) 10 MG tablet Take 10 mg by mouth at bedtime.    [provider]  predniSONE  (DELTASONE ) 5 MG tablet Take 1 tablet by mouth daily. 03/19/21   [provider]  TEZSPIRE 210 MG/1. syringe  08/29/21   [provider]  tiotropium (SPIRIVA) 18 MCG inhalation capsule Place 18 mcg into inhaler and inhale daily.  [provider]  Topiramate  ER (TROKENDI  XR) 100 MG CP24 Take 1 capsule by mouth daily.    [provider]  valACYclovir (VALTREX) 1000 MG tablet  02/13/22   [provider]  zolpidem  (AMBIEN ) 5 MG tablet Take 5 mg by mouth at bedtime as needed for sleep.    [provider]      Allergies    Carbamazepine, Hydrocodone -acetaminophen , Lamictal [lamotrigine], and Fluconazole    Review of Systems   Review of Systems  Constitutional:  Negative for activity change, appetite change, fatigue and fever.   HENT:  Negative for congestion and rhinorrhea.   Respiratory:  Negative for cough, chest tightness and shortness of breath.   Gastrointestinal:  Positive for abdominal pain, anal bleeding, blood in stool and hematochezia. Negative for nausea and vomiting.  Genitourinary:  Negative for dysuria and hematuria.  Musculoskeletal:  Negative for arthralgias and myalgias.  Skin:  Negative for rash.  Neurological:  Negative for dizziness, weakness and headaches.   all other systems are negative except as noted in the HPI and PMH.    Physical Exam Updated Vital Signs BP 139/80 (BP Location: Right Arm)   Pulse 83   Temp 98.1 F (36.7 C) (Oral)   Resp 20   Ht 5' 1.5 (1.562 m)   Wt 84.8 kg   LMP 07/23/2016 (Exact Date)   SpO2 98%   BMI 34.76 kg/m  Physical Exam Vitals and nursing note reviewed.  Constitutional:      General: She is not in acute distress.    Appearance: She is well-developed.  HENT:     Head: Normocephalic and atraumatic.     Mouth/Throat:     Pharynx: No oropharyngeal exudate.  Eyes:     Conjunctiva/sclera: Conjunctivae normal.     Pupils: Pupils are equal, round, and reactive to light.  Neck:     Comments: No meningismus. Cardiovascular:     Rate and Rhythm: Normal rate and regular rhythm.     Heart sounds: Normal heart sounds. No murmur heard. Pulmonary:     Effort: Pulmonary effort is normal. No respiratory distress.     Breath sounds: Normal breath sounds.  Abdominal:     Palpations: Abdomen is soft.     Tenderness: There is abdominal tenderness. There is no guarding or rebound.     Comments: Tender suprapubic and left lower quadrant, no guarding or rebound  Genitourinary:    Comments: Chaperone present Musician.  Small external hemorrhoids seen.  Some gross blood in examining finger.  No fissures Musculoskeletal:        General: No tenderness. Normal range of motion.     Cervical back: Normal range of motion and neck supple.  Skin:    General: Skin  is warm.  Neurological:     Mental Status: She is alert and oriented to person, place, and time.     Cranial Nerves: No cranial nerve deficit.     Motor: No abnormal muscle tone.     Coordination: Coordination normal.     Comments:  5/5 strength throughout. CN 2-12 intact.Equal grip strength.   Psychiatric:        Behavior: Behavior normal.     ED Results / Procedures / Treatments   Labs (all labs ordered are listed, but only abnormal results are displayed) Labs Reviewed  COMPREHENSIVE METABOLIC PANEL - Abnormal; Notable for the following components:      Result Value   CO2 21 (*)    All other  components within normal limits  OCCULT BLOOD X 1 CARD TO LAB, STOOL - Abnormal; Notable for the following components:   Fecal Occult Bld POSITIVE (*)    All other components within normal limits  CBC    EKG None  Radiology CT ABDOMEN PELVIS W CONTRAST Result Date: 05/18/2023 CLINICAL DATA:  Bright red blood per rectum. Pulmonary embolism suspected, positive D-dimer. EXAM: CT ANGIOGRAPHY CHEST CT ABDOMEN AND PELVIS WITH CONTRAST TECHNIQUE: Multidetector CT imaging of the chest was performed using the standard protocol during bolus administration of intravenous contrast. Multiplanar CT image reconstructions and MIPs were obtained to evaluate the vascular anatomy. Multidetector CT imaging of the abdomen and pelvis was performed using the standard protocol during bolus administration of intravenous contrast. RADIATION DOSE REDUCTION: This exam was performed according to the departmental dose-optimization program which includes automated exposure control, adjustment of the mA and/or kV according to patient size and/or use of iterative reconstruction technique. CONTRAST:  OMNIPAQUE  IOHEXOL  350 MG/ML SOLN COMPARISON:  Chest CT 07/30/2018.  Abdominal CT 03/24/2020 FINDINGS: CTA CHEST FINDINGS Cardiovascular: Satisfactory opacification of the pulmonary arteries to the segmental level. No evidence of  pulmonary embolism. Normal heart size. No pericardial effusion. Mediastinum/Nodes: No mass or adenopathy.  Unremarkable esophagus Lungs/Pleura: Mild cylindrical bronchiectasis best seen in the lower lungs is stable. Mild scarring at the lingula and right middle lobe. Mild dependent ground-glass opacity is likely atelectasis. There is no edema, consolidation, effusion, or pneumothorax. Musculoskeletal: Left paracentral calcified structure contiguous with calcified disc narrowing at T7-8. Implied impingement on the exiting nerve root and thoracic cord, long-standing. Review of the MIP images confirms the above findings. CT ABDOMEN and PELVIS FINDINGS Hepatobiliary: No focal liver abnormality.Cholecystectomy. No biliary dilatation. Pancreas: Unremarkable. Spleen: Unremarkable. Adrenals/Urinary Tract: Negative adrenals. No hydronephrosis or stone. Unremarkable bladder. Stomach/Bowel: No obstruction. No appendicitis. Distal colonic diverticulosis. Moderate stool Vascular/Lymphatic: No acute vascular abnormality. No mass or adenopathy. Reproductive:No pathologic findings.  Tubal occlude or spur. Other: No ascites or pneumoperitoneum. Musculoskeletal: No acute abnormalities. Facet spurring on the left at L4-5, crowding the foramen. Review of the MIP images confirms the above findings. IMPRESSION: Chest CTA: 1. Negative for pulmonary embolism or other acute finding. 2. 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 Abdominal CT: 1. No acute finding or specific cause for symptoms. 2. Colonic diverticulosis. Electronically Signed   By: Dorn Roulette M.D.   On: 05/18/2023 06:05   CT Angio Chest PE W and/or Wo Contrast Result Date: 05/18/2023 CLINICAL DATA:  Bright red blood per rectum. Pulmonary embolism suspected, positive D-dimer. EXAM: CT ANGIOGRAPHY CHEST CT ABDOMEN AND PELVIS WITH CONTRAST TECHNIQUE: Multidetector CT imaging of the chest was performed using the standard  protocol during bolus administration of intravenous contrast. Multiplanar CT image reconstructions and MIPs were obtained to evaluate the vascular anatomy. Multidetector CT imaging of the abdomen and pelvis was performed using the standard protocol during bolus administration of intravenous contrast. RADIATION DOSE REDUCTION: This exam was performed according to the departmental dose-optimization program which includes automated exposure control, adjustment of the mA and/or kV according to patient size and/or use of iterative reconstruction technique. CONTRAST:  OMNIPAQUE  IOHEXOL  350 MG/ML SOLN COMPARISON:  Chest CT 07/30/2018.  Abdominal CT 03/24/2020 FINDINGS: CTA CHEST FINDINGS Cardiovascular: Satisfactory opacification of the pulmonary arteries to the segmental level. No evidence of pulmonary embolism. Normal heart size. No pericardial effusion. Mediastinum/Nodes: No mass or adenopathy.  Unremarkable esophagus Lungs/Pleura: Mild cylindrical bronchiectasis best seen in the  lower lungs is stable. Mild scarring at the lingula and right middle lobe. Mild dependent ground-glass opacity is likely atelectasis. There is no edema, consolidation, effusion, or pneumothorax. Musculoskeletal: Left paracentral calcified structure contiguous with calcified disc narrowing at T7-8. Implied impingement on the exiting nerve root and thoracic cord, long-standing. Review of the MIP images confirms the above findings. CT ABDOMEN and PELVIS FINDINGS Hepatobiliary: No focal liver abnormality.Cholecystectomy. No biliary dilatation. Pancreas: Unremarkable. Spleen: Unremarkable. Adrenals/Urinary Tract: Negative adrenals. No hydronephrosis or stone. Unremarkable bladder. Stomach/Bowel: No obstruction. No appendicitis. Distal colonic diverticulosis. Moderate stool Vascular/Lymphatic: No acute vascular abnormality. No mass or adenopathy. Reproductive:No pathologic findings.  Tubal occlude or spur. Other: No ascites or pneumoperitoneum.  Musculoskeletal: No acute abnormalities. Facet spurring on the left at L4-5, crowding the foramen. Review of the MIP images confirms the above findings. IMPRESSION: Chest CTA: 1. Negative for pulmonary embolism or other acute finding. 2. 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 Abdominal CT: 1. No acute finding or specific cause for symptoms. 2. Colonic diverticulosis. Electronically Signed   By: Dorn Roulette M.D.   On: 05/18/2023 06:05    Procedures Procedures    Medications Ordered in ED Medications - No data to display  ED Course/ Medical Decision Making/ A&P                                 Medical Decision Making Amount and/or Complexity of Data Reviewed Labs: ordered. Decision-making details documented in ED Course. Radiology: ordered and independent interpretation performed. Decision-making details documented in ED Course. ECG/medicine tests: ordered and independent interpretation performed. Decision-making details documented in ED Course.  Risk OTC drugs. Prescription drug management.   Rectal bleeding since yesterday.  Not associated with bowel movements.  Stable vital signs.  Abdomen soft without peritoneal signs.  No blood thinner use.  Hemoglobin obtained in triage is stable.  Does have small external hemorrhoids on rectal exam.  Concern for likely hemorrhoidal versus diverticular bleeding.  She reports reassuring colonoscopy last year.  Remains stable.  She is not anticoagulated.  Hemoglobin is stable.  Orthostatics are negative.  CT scan shows diverticulosis which is likely the source of her rectal bleeding.  She remains hemodynamically stable.  No evidence of pneumonia or pulmonary embolism to explain her shortness of breath.   Discussed increasing fiber in her diet and follow-up with PCP as well as gastroenterology. She is made aware of the bulging disc in her thoracic region.  She is asymptomatic from this  currently.  Follow up with PCP as well as gastroenterology.  Return precautions discussed        Final Clinical Impression(s) / ED Diagnoses Final diagnoses:  Rectal bleeding    Rx / DC Orders ED Discharge Orders     None         Florestine Carmical, Garnette, MD 05/18/23 0710

## 2023-05-18 NOTE — Discharge Instructions (Addendum)
 Your testing is reassuring.  Take the MiraLAX  for constipation as prescribed.  Follow-up with your primary doctor as well as gastroenterologist.  Return to the ED with worsening rectal bleeding, abdominal pain, dizziness, lightheadedness, shortness of breath or any other concerns

## 2023-05-18 NOTE — ED Triage Notes (Signed)
 Pt reports bright red rectal bleeding that started yesterday morning. Pt states she did not see any external hemorrhoids but has been told she had internal hemorrhoids. Also endorses some lower abd  pain on R and L. Recent use of steroids and Meloxicam.

## 2023-05-19 NOTE — Telephone Encounter (Signed)
 Patient has visit  07/10/23 [facility], rectal bleeding 5-6 times without a bm. Labs stable per patient, thought to be hemorrhoidal/diverticular . Advised Ed  for continued bleeding if this is happening. IF S/S of anemia ED as well.Placed on cx list.

## 2023-05-20 NOTE — Telephone Encounter (Signed)
 Patient is advised lower abd pain left side  continues . No worse bleeding has subsided.

## 2023-05-20 NOTE — Progress Notes (Signed)
 Location Information: Patient State (at time of visit): [place]  Patient Location (at time of visit):Home/Other Non-Medical  Provider Location: Non-Provider-Based Clinic (Clinic, non-hospital) Is provider licensed to provide clinical care in the current location/state of the patient? Yes   Consent:  Patient's identity was confirmed. Presenting condition or illness was discussed with the patient/personal representative. Current proposed treatment for presenting condition or illness was explained to patient/personal representative along with the likely benefits and any significant risks or complications associated with the provision of treatment by audio/video means. The patient/personal representative verbally authorized treatment to be provided by audio/video, which may include a limited review of patient's current health status, medication, or other treatment recommendations, patient education, and an opportunity to ask questions about condition and treatment. Verbal Consent Granted by Patient/Personal Representative:Yes   Visit Information: Modality: 2-Way Real-Time Audio/Video  Video Start Time: 13:55 Video Stop Time: 14:10 Video Total Time: 10 minutes not including chart review and preparation.      Pulmonary Medicine Outpatient Clinic   Primary Care Physician: Alexis LELON Ferrier, MD Referring Provider: Self, Referral  Dear Dr. Norleen LELON Hall, we saw your patient Alexis Hall today in asthma clinic. Below is the documentation for today's visit.  Interval History:   Chief Complaint  Patient presents with  . Asthma    Follow up    Last Clinic Visit - Initial Evaluation 03/2023  History of Present Illness:  Alexis Hall is an 53 y.o. female who presents for evaluation/management of adult onset (with h/o childhood symptoms) severe persistent eosinophilic asthma.   Medical History is significant for BMI 35, GERD, AR, bronchiectasis, laryngeal spasm, migraine, essential  hypertension, glycemia, dysphonia (complete list below).    Alexis Hall presents to clinic today to establish care after being followed at [facility] allergy and asthma, she is now on test by and has been for about 12 months and continues to be on daily prednisone  of at least 5 mg a day.  She does not feel to [place] doing as well as it could.  She is also on Symbicort Spiriva Singulair .  Albuterol .  She is without fevers chills cough wheeze shortness of breath but she does have morning cough and sputum production is clear.  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  Asthma History: Diagnosed as a child at about age 17 or 7 would run around play with kids however she would have to stop when she gets short of breath and wheezy.  URIs are always set off her asthma as well and then her symptoms abate until about age 69 when symptoms return.  Family history is aiming for sister and brother who have asthma her mother and dad were both smokers and so there was a secondary smoke exposure there.  In work experiences NP at [employer].  No known industrial exposures but she does endorse a bird feather allergy.  She had pneumonia twice in her lifetime most recently 2023.  No history of pneumothoraces.  No ED admissions ICU stays intubations or anaphylactic events.  Known triggers include weather changes strong smells URIs are strong cleaning agents.  Last steroid burst daily prednisone .  Pregnancy did not affect asthma.  Current therapy is Spiriva Symbicort Singulair  albuterol .  She has been on inhaler for the inhaler therapy for at least 23 years.  She is tolerating test prior no injection site reactions or adverse effects but does not feel therapy is maximized.  Seasonal allergies are worse in fall and  spring as she is not really sure what her best time of year is.  She has had skin prick testing before positive for mold cedar trees grasses dust mites cats cockroaches.  She is on IT therapy with  [facility].  No history of mast cell activation syndrome.  No food allergies no aspirin  allergies no nasal polyps denies neuropathies.  She does have GERD that is controlled on Nexium.  She denies chest pain.  No smoking history.  Does not have sleep apnea she is not on supplemental oxygen.  Her home has central air and heat and has 1 dog inside, flooring is hardwood basement is dry they change her filters regularly and they have a gas fireplace to use every now and then.  She is up-to-date with immunizations including flu pneumonia and COVID.  No recent travel.  She is a good historian judgment appears intact.  Today May 20, 2023  Alexis Hall presents for follow up via Telehealth and reports she was recently treated for pneumonia with an antibiotic and steroids which she completed 2 days ago.  However in the last 48 hours she is in the [place] sinus congestion with clear drainage.  This is affected her sleep and she left work early today as she did not feel well and not up to par.  She denies fever chills or hot flashes.  Cough is minimally productive of light yellow sputum.  She denies associated chest chest pain or pleuritic pain.  She did a home COVID and flu test that was negative.  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  She did go to the emergency room for stomach pain over the weekend and was possibly exposed to illness there.  Albuterol  uses 2 puffs twice a day as of yesterday and is only used it once today.  When she is tired her sleep.  Interrupted last night.  She has resumed her prednisone  5 mg a day.  She does endorse decreased appetite and decreased water intake.  Denies nausea or vomiting.   General ROS: positive for  - fatigue, hot flashes, and sleep disturbance negative for - chills, malaise, or night sweats Allergy and Immunology ROS: negative ENT ROS: positive for - nasal congestion negative for - epistaxis, headaches, hearing change, oral lesions,  sinus pain, sneezing, sore throat, tinnitus, vertigo, or visual changes Respiratory ROS: positive for - cough, shortness of breath, sputum changes, wheezing, and chest tightness negative for - hemoptysis, orthopnea, pleuritic pain, stridor, or tachypnea Cardiovascular ROS: no chest pain or dyspnea on exertion GI ROS:  negative  Asthma Medications ICS (na) ICS/LABA (Symbicort 160-4.5 2 p BID) +  ICS/LABA/LAMA (an) LABA/LAMA (na) LTRA/5LOI  (montelukast ) +  Spiriva Respimat 1.25 2 p/d + Combivent + Chronic macrolide therapy + Xolair + IT Chronic IM or oral Corticosteroids + Benralizumab No - switching to as of 04/03/2023 Mepolizumab  No Dupilumab  No Reslizumab No Tezepelumab- ekko Yes - currently on for past year (2023)  Asthma Exacerbations Last exacerbation: > 12 months Last systemic steroids: daily Bursts in the past 12 months > 12 months  Asthma Control Test Previously 10 - defer today  Current Disease Severity Alexis Hall has frequent daytime asthma symptoms. She has frequent nighttime asthma symptoms. The patient is using short-acting beta agonists for symptom control daily. She has exacerbations requiring oral systemic corticosteroids 0 times per year. Current limitations in activity from asthma: none. Number of days of school or work missed  in the last month: 0. Number of urgent/emergent visit in last year: 1   RESEARCH PARTICIPANT: No WHICH PROTOCOL?  n/a  ASTHMA HISTORY: Age of Asthma onset: age 29-8, remision and resume in her 30's Intubation HX: No Hospitalized in past: No ASA sensitivity: No ALLERGIES: No,  Eosinophils historically 0 as she is on chronic steroids which suppress eosinophils  Previous Report Reviewed: historical medical records, lab reports, office notes, radiology reports, and x-ray reports   The following portions of the patient's history were reviewed and updated as appropriate: allergies, current medications, past family history, past medical  history, past social history, past surgical history, and problem list.  Current Tobacco History Social History   Tobacco Use  Smoking Status Never  . Passive exposure: Never  Smokeless Tobacco Never     Review of Systems A complete ROS was performed with pertinent positives/negatives noted in the HPI. The remainder of the ROS are negative.   Past History:   Active Ambulatory Problems    Diagnosis Date Noted  . Irregular bleeding 09/13/2015  . Seasonal allergic rhinitis 09/01/2013  . Extrinsic asthma without complication 06/16/2007  . Moderate episode of recurrent major depressive disorder (HCC) 04/16/2016  . Gastroesophageal reflux disease without esophagitis 04/16/2016  . Migraine 04/16/2016  . Vitamin D deficiency 04/16/2016  . S/P endometrial ablation 05/21/2016  . Other insomnia 05/02/2017  . Recurrent vaginitis 09/09/2017  . Low back pain 10/20/2017  . Cervicalgia 10/20/2017  . Chronic cough 07/24/2018  . Tendinitis of right rotator cuff 08/14/2018  . Weight gain 10/28/2018  . BMI 32.0-32.9,adult 10/28/2018  . Foraminal stenosis of cervical region 05/26/2019  . Nuclear sclerotic cataract of both eyes 11/16/2019  . Cortical age-related cataract of both eyes 11/16/2019  . Refractive amblyopia of left eye 11/16/2019  . Glaucoma suspect of both eyes 11/16/2019  . Bronchiectasis with acute exacerbation (HCC) 01/18/2020  . Capsulitis of metatarsophalangeal (MTP) joint of left foot 12/24/2021  . Essential (primary) hypertension 09/26/2022  . Hyperglycemia 09/26/2022  . Steroid-dependent asthma with acute exacerbation 11/18/2022  . Immunosuppressed status (CMD) 11/18/2022  . Globus sensation 01/30/2023  . Dysphonia 01/30/2023  . Laryngeal spasm 01/30/2023   Resolved Ambulatory Problems    Diagnosis Date Noted  . No Resolved Ambulatory Problems   Past Medical History:  Diagnosis Date  . Allergic rhinitis   . Allergy   . Asthma   . Asthma, extrinsic   . Asthma,  intrinsic   . Bronchiectasis (CMD)   . Cataract   . Colon polyp   . Depression   . GERD (gastroesophageal reflux disease)   . Hypertension   . Infertility management   . Intermittent low back pain   . Primary central sleep apnea   . Sleep apnea, obstructive    Family History  Problem Relation Name Age of Onset  . Rheum arthritis Mother    . Diabetes Mother         Pre-diabetes  . Hypertension Mother    . Hypothyroidism Mother    . Glaucoma Mother    . Hyperlipidemia Father    . Hypertension Father    . Colon polyps Father    . Prostate cancer Father    . Deep vein thrombosis Sister    . Pulmonary disease Sister    . Clotting disorder Sister    . Asthma Brother    . Hypertension Brother         no treatment  . Asthma Son Honora   . Breast  cancer Maternal Aunt    . Lymphoma Maternal Aunt    . Macular degeneration Neg Hx     Social History Social History   Tobacco Use  . Smoking status: Never    Passive exposure: Never  . Smokeless tobacco: Never  Vaping Use  . Vaping status: Never Used  Substance Use Topics  . Alcohol use: No  . Drug use: No   Social History   Social History Narrative  . Not on file     Medications:  I have documented the patient's current medications to the best of my ability.  See list below (updated today)  Physical Exam:   There were no vitals filed for this visit.  No exam - Telehealth visit.  Able to speak in complete sentences, no obvious distress, normal work of breathing.    Diagnostic Review:   I personally reviewed the following:  Imaging reports (x-ray and/or ct), laboratory results including cultures, EKG results, TTE, pulmonary function results and allergy panel results that were available at the time of the visit.     PULMONARY FUNCTION TESTING  Date [place] -     FEV1 % pred Normal    FVC % pred     FEV1/FVC %     FEF 25-75% pred     TLC % pred     RV % pred     DLCO uncorr % pred      Methacholine  Challenge? No  RADIOLOGY  CXR Yes, When: 01/2023, Where: outside facility No cardiopulmonary abnormality Chest CT No, When: na, Where na  Cardiac Function: EKG -  Date/Time: Monday October 28 2022 18:44:09 EDT Ventricular Rate: 74 PR Interval: 135 QRS Duration: 87 QT Interval: 402 QTC Calculation: 446 R Axis: 92 Text Interpretation: Sinus rhythm Borderline right axis deviation Confirmed by Ruthe, Adam ([id number]) on 10/28/2022 6:46:39 PM   Assessment:   1. Acute nasopharyngitis   2. Eosinophilic asthma   3. Poorly controlled severe persistent asthma without complication     53 y.o. female with lifelong severe persistent significant eosinophilic asthma experiencing common cold however she recently experienced pneumonia treated with a short steroid burst.  Recommend repeat prednisone  taper, scheduled albuterol  with gentle chest percussion and hold antibiotics for now since she just completed therapy and is without fever or chills; does not look toxic on camera.  Push fluids, healthy diet and get good rest - return to work on May 23, 2023.   Asthma control not optimized yet as she has had one dose of Fasenra - 2nd dose is scheduled for this Friday.   Continue Symbicort BID, singlair, albuterol  and Fasenra as well prednisone  5 mg daily once she completes the taper.  After dose 4 (first 8 week interval) will plan to wean by 1 mg every 4 weeks.     Vaccinations Needed Today: none Plan:   Good to see you Alexis Hall - it appears to be viral for now but since you just had pneumonia I will repeat the prednisone  taper, start chest percussion after you use albuterol .  Albuterol  2 - 4 puffs or one neb then 5 - 10 gentle chest percussions to break up any slime; front and back.   Push fluids, eat healthy and try to get good rest.    I recommend you not go back to work until Friday May 23, 2023.    Continue Fasenra every 4 weeks until you have had the 3rd injection, then start every 8 weeks. After  injection #4  we will plan to wean off prednisone  by 1 mg every 4 weeks.   For now continue Symbicort 2 puffs twice a day, spiriva once a day, singulair  at night and albuterol  as needed.   Keep appt on Feb 19 at 1 pm.    Non-urgent needs:  Call [phone] or email [email or web address].    Coming to clinic:  Make sure you notify the front desk when you arrive to clinic.   ACTION PLAN:  It is not a bad idea to monitor your pulse ox readings - normal is 94% or higher.  Pulse oximeters are relatively cheap ($13-$22).  If your sats are below 94% (especially after a treatment) seek emergent care/activate 911.    IF you have symptoms (chest tightness, shortness of breath, wheezing, cough) that require a neb treatment (albuterol  alone or duoneb if you have it) or 4 puffs of albuterol  and you do not feel any relief in 20 minutes repeat the treatment and if you still are having trouble 20 minutes later, start the third treatment and call 911 or get to the ED as you are failing treatment - you are in trouble.    At this point if you have an epipen you could use it BUT get evaluated in the ED - one dose of the epipen may not be enough.    If you are in acute distress, call 911/ED for evaluation as we simply cannot treat you in our clinic for an exacerbation.  Please do not come to clinic asking for emergent treatment.    IF you are traveling, remember to take your prednisone  taper with you just in case, if you need a refill please ask - maybe call and schedule a kenalog shot a week or two before traveling.    IF you are experiencing a severe allergic reaction to food or bee stings - use your Epi-pen (if you have one) and call 911.  If you use your Epi-pen and feel better get to the ED anyway as you may need a second injection and you may only feel better for 15 minutes or less.    Remember to wear your mask, wash your hands and keep safe distances from crowds during our normal cold and flu season.  Covid has 2  seasons now however symptoms are typically much less severe.    Stay current with all immunizations/vaccines.  If you do become ill it is likely to be less severe.  Smoking/vaping are not recommended.    Maintain a healthy diet,  drink plenty of water, start/continue an exercise regimen (get out of the house, go walk [place] is a pretty state) and maintain a good sleep regimen.  Are you interested in doing asthma research?  We have a few studies that are observational in nature (phone calls, questionnaires, breathing tests) or investigational testing new medications or devices for severe asthma.    You may be compensated for your time, you get paid via [employer] which is like a debit card that we fill after each visit.   Some of our studies last 6 - 12 months or longer, you will be seen by our on site medical provider and as such will not need to be seen in clinic while participating in the research trial.   If interested please let me know by calling our [facility] at [phone].  Go to the following website to look for possible coupon savings:    www.cou-co.com  or visit the manufacturer's  web page for patient assistance.    Immunization History  Administered Date(s) Administered  . Influenza, Injectable, Mdck, Preservative Free,quadrivalent 02/09/2022  . Influenza, Injectable, Quadrivalent, Preservative Free 03/01/2019, 02/19/2020, 03/15/2021  . Influenza, Unspecified 02/10/2018, 05/24/2021  . Influenza, split virus, trivalent, preservative 02/10/2013, 02/06/2016  . Influenza,split virus, trivalent, PF 01/16/2010, 01/25/2011  . MMR 12/08/1989, 03/13/2018  . Moderna Covid-19, mRNA,LNP-S,PF 12+ Yrs 01/31/2022, 02/15/2023  . Moderna SARS-CoV-2 Bivalent Booster 6+ yrs 02/27/2021  . Moderna SARS-CoV-2 Primary Series 12+ yrs 05/19/2019, 06/16/2019, 01/23/2020, 06/26/2020, 01/03/2021  . Pfizer SARS-CoV-2 Primary Series 12+ yrs 09/01/2020  . Pneumococcal Conjugate 20-Valent  (PCV20) 09/26/2022  . Pneumococcal Polysaccharide 05/13/2010  . Tdap 02/15/2013, 03/31/2023  . mdfluenza, MDCK, trivalent, PF 02/15/2023    No follow-ups on file.  Asthma Care Coordination: More than 50% of this visit was spent counseling patient about asthma treatments, proper inhaler techniques, action plan personalized for patient.  I spent 45 minutes with patient; more than 50% of the time was spent counseling patient and arranging treatment.  Electronically Signed by: Reyes MALVA Piles, FNP 05/20/2023 1:49 PM  We observed the patient's inhaler use today and reviewed proper technique.  We reviewed the patient's asthma action plan today and they have good understanding of the plan. See patient instructions for details of personalized action plan.   PQRS for Persistent Asthma The patient has persistent asthma. Current symptom control and medication use have been reviewed as documented above. The patient has been prescribed a Preferred Long-Term Control Medication or Acceptable Alternative as documented in the med list or orders.  Meds Ordered in [place]  Medication Sig Dispense Refill  . acetaminophen  (TYLENOL ) 500 mg tablet Take 500 mg by mouth every 6 (six) hours as needed for mild pain (1-3).    . albuterol  2.5 mg /3 mL (0.083 %) nebulizer solution Take 2.5 mg by nebulization every 4 (four) hours as needed for wheezing or shortness of breath. 30 each 11  . amLODIPine (NORVASC) 5 mg tablet TAKE ONE (1) TABLET BY MOUTH EVERY DAY 90 tablet 3  . ARIPiprazole  (ABILIFY ) 10 mg tablet Take 10 mg by mouth 2 (two) times a day.    SABRA azelastine (ASTELIN) 137 mcg (0.1 %) nasal spray Administer 1 spray into each nostril 2 (two) times a day.    . benralizumab (Fasenra Pen) 30 mg/mL atIn auto-injector Inject 1 mL (30 mg total) under the skin every 28 days for 3 doses. 3 mL 0  . benralizumab (FASENRA) 30 mg/mL atIn auto-injector Inject 1 mL (30 mg total) under the skin every 8 (eight) weeks. 1 mL 6  .  budesonide-formoteroL (SYMBICORT) 160-4.5 mcg/actuation inhaler Inhale 2 puffs 2 (two) times a day.    . buPROPion  (WELLBUTRIN  XL) 150 mg 24 hr tablet Take 150 mg by mouth in the morning.    . cetirizine (ZyrTEC) 10 mg tablet Take 10 mg by mouth daily.    . cholecalciferol (VITAMIN D3) 2,000 unit cap capsule Take 1 capsule by mouth Once Daily.    . clonazePAM  (KlonoPIN ) 0.5 mg tablet Take 0.5 mg by mouth daily. As needed for severe anxiety    . cyproheptadine  (PERIACTIN ) 4 mg tablet TAKE 1/2 TABLET BY MOUTH TWICE DAILY 90 tablet 2  . diclofenac (VOLTAREN) 75 mg EC tablet Take 1 tablet (75 mg total) by mouth 2 (two) times a day. 60 tablet 0  . dicyclomine (BENTYL) 10 mg capsule Take 10 mg by mouth 3 (three) times a day for 30 days. 90 capsule  1  . DULoxetine  (CYMBALTA ) 60 mg capsule TAKE ONE (1) CAPSULE BY MOUTH 2 TIMES DAILY 180 capsule 1  . EpiPen 2-Pak 0.3 mg/0.3 mL injection syringe     . esomeprazole (NexIUM) 40 mg DR capsule TAKE ONE (1) CAPSULE BY MOUTH 2 TIMES DAILY 30 MINUTES BEFORE BREAKFAST AND DINNER 180 capsule 0  . fluticasone  propionate (FLONASE) 50 mcg/spray nasal spray SHAKE LIQUID AND USE 2 SPRAYS IN EACH NOSTRIL DAILY 16 g 3  . frovatriptan 2.5 mg tab TAKE 1 TABLET BY MOUTH AT EARLIEST ONSETOF HEADACHE. MAY REPEAT IN 2 HOURS IF NEEDED. DO NOT EXCEED MORE THAN 2 TABLETS IN 24 HOURS. 12 tablet 3  . hydrocortisone  (ANUSOL -HC) 25 mg suppository Insert 1 suppository in rectum at bedtime prn hemorrhoid 12 suppository 4  . ketoconazole (NIZORAL) 2 % cream Apply 1 Application topically daily.    . levETIRAcetam  (KEPPRA ) 500 mg tablet TAKE 1 TABLET BY MOUTH TWICE DAILY 180 tablet 1  . meloxicam (MOBIC) 15 mg tablet Take 1 tablet (15 mg total) by mouth daily. 30 tablet 0  . methocarbamoL  (ROBAXIN ) 500 mg tablet Take 500 mg by mouth 3 (three) times a day as needed. 90 tablet 0  . montelukast  (SINGULAIR ) 10 mg tablet TAKE 1 TABLET BY MOUTH DAILY 90 tablet 3  . multivitamin (THERAGRAN) tab  tablet Take 1 tablet by mouth Once Daily.    . predniSONE  (DELTASONE ) 5 mg tablet Take 1 tablet (5 mg total) by mouth daily. Currently on taper 01/30/23 90 tablet 0  . ProAir  HFA 90 mcg/actuation inhaler Inhale 2 puffs every 6 (six) hours as needed. 18 g 5  . promethazine  (PHENERGAN ) 12.5 mg tablet Take 1 tablet (12.5 mg total) by mouth every 8 (eight) hours as needed for nausea. 30 tablet 1  . Spiriva Respimat 1.25 mcg/actuation mist inhaler INHALE 2 PUFFS BY MOUTH EVERY DAY    . topiramate  (Trokendi  XR) 200 mg 24 hr capsule Take 1 capsule (200 mg total) by mouth daily. 90 capsule 1  . valACYclovir (VALTREX) 1 gram tablet TAKE ONE TABLET BY MOUTH EVERY 12 HOURS 60 tablet 1  . zolpidem  (AMBIEN ) 10 mg tablet Take 10 mg by mouth nightly as needed.  3   No current Epic-ordered facility-administered medications on file.     Portions of this note may be dictated using Dragon Naturally Speaking voice recognition software. Variances in spelling and vocabulary are possible and unintentional.  Not all errors are caught/corrected.   Please notify the dino if any discrepancies are noted or if the meaning of any statement is unclear.   Electronically signed by: Reyes MALVA Piles, FNP 05/20/2023 1:49 PM

## 2023-05-20 NOTE — Telephone Encounter (Signed)
 Patient  is advised , she has bentyl at home from [facility] advised it is ok to use that  [facility] meals. 20-30 minutes. Had a stool as well with some spots of blood. Advised that she observe , if the bleeding becomes  as it was before please call.

## 2023-05-28 NOTE — Progress Notes (Signed)
 Patient presents for  Chief Complaint  Patient presents with  . Asthma  . Cough    SUBJECTIVE  Patient here for asthma flare up, wheezing and coughing x 1 week. She has some head congestion and fatigued. She has tried Nyquil, Dayquil, Prednisone  taper that she received from Pulmonologist x 6 days ago. Last Friday was also given zithromax.  She is also using Albuterol  inhaler. Still experiencing low grade temps Using albuterol  inhaler every 4 hours   Allergies  Allergen Reactions  . Carbamazepine Angioedema and Swelling  . Fluconazole Rash  . Hydrocodone  GI Intolerance  . Lamotrigine Rash  . Hydrocodone -Acetaminophen  Other (See Comments)     Current Outpatient Medications:  .  acetaminophen  (TYLENOL ) 500 mg tablet, Take 500 mg by mouth every 6 (six) hours as needed for mild pain (1-3)., Disp: , Rfl:  .  albuterol  2.5 mg /3 mL (0.083 %) nebulizer solution, Take 2.5 mg by nebulization every 4 (four) hours as needed for wheezing or shortness of breath., Disp: 30 each, Rfl: 11 .  amLODIPine (NORVASC) 5 mg tablet, TAKE ONE (1) TABLET BY MOUTH EVERY DAY, Disp: 90 tablet, Rfl: 3 .  ARIPiprazole  (ABILIFY ) 10 mg tablet, Take 10 mg by mouth 2 (two) times a day., Disp: , Rfl:  .  azelastine (ASTELIN) 137 mcg (0.1 %) nasal spray, Administer 1 spray into each nostril 2 (two) times a day., Disp: , Rfl:  .  azithromycin (ZITHROMAX) 250 mg tablet, Take 1 tablet (250 mg total) by mouth daily for 7 days., Disp: 7 tablet, Rfl: 0 .  benralizumab (Fasenra Pen) 30 mg/mL atIn auto-injector, Inject 1 mL (30 mg total) under the skin every 28 days for 3 doses., Disp: 3 mL, Rfl: 0 .  benralizumab (FASENRA) 30 mg/mL atIn auto-injector, Inject 1 mL (30 mg total) under the skin every 8 (eight) weeks., Disp: 1 mL, Rfl: 6 .  benzonatate (TESSALON) 200 mg capsule, TAKE 1 CAPSULE (200 MG TOTAL) BY MOUTH 3 (THREE) TIMES A DAY AS NEEDED FOR COUGH., Disp: 20 capsule, Rfl: 0 .  budesonide-formoteroL (SYMBICORT) 160-4.5  mcg/actuation inhaler, Inhale 2 puffs 2 (two) times a day., Disp: , Rfl:  .  buPROPion  (WELLBUTRIN  XL) 150 mg 24 hr tablet, Take 150 mg by mouth in the morning., Disp: , Rfl:  .  cetirizine (ZyrTEC) 10 mg tablet, Take 10 mg by mouth daily., Disp: , Rfl:  .  cholecalciferol (VITAMIN D3) 2,000 unit cap capsule, Take 1 capsule by mouth Once Daily., Disp: , Rfl:  .  clonazePAM  (KlonoPIN ) 0.5 mg tablet, Take 0.5 mg by mouth daily. As needed for severe anxiety, Disp: , Rfl:  .  cyproheptadine  (PERIACTIN ) 4 mg tablet, TAKE 1/2 TABLET BY MOUTH TWICE DAILY, Disp: 90 tablet, Rfl: 2 .  DULoxetine  (CYMBALTA ) 60 mg capsule, TAKE ONE (1) CAPSULE BY MOUTH 2 TIMES DAILY, Disp: 180 capsule, Rfl: 1 .  EpiPen 2-Pak 0.3 mg/0.3 mL injection syringe, , Disp: , Rfl:  .  esomeprazole (NexIUM) 40 mg DR capsule, TAKE ONE (1) CAPSULE BY MOUTH 2 TIMES DAILY 30 MINUTES BEFORE BREAKFAST AND DINNER, Disp: 180 capsule, Rfl: 0 .  fluticasone  propionate (FLONASE) 50 mcg/spray nasal spray, SHAKE LIQUID AND USE 2 SPRAYS IN EACH NOSTRIL DAILY, Disp: 16 g, Rfl: 3 .  frovatriptan 2.5 mg tab, TAKE 1 TABLET BY MOUTH AT EARLIEST ONSETOF HEADACHE. MAY REPEAT IN 2 HOURS IF NEEDED. DO NOT EXCEED MORE THAN 2 TABLETS IN 24 HOURS., Disp: 12 tablet, Rfl: 3 .  hydrocortisone  (ANUSOL -HC)  25 mg suppository, Insert 1 suppository in rectum at bedtime prn hemorrhoid, Disp: 12 suppository, Rfl: 4 .  ketoconazole (NIZORAL) 2 % cream, Apply 1 Application topically daily., Disp: , Rfl:  .  levETIRAcetam  (KEPPRA ) 500 mg tablet, TAKE 1 TABLET BY MOUTH TWICE DAILY, Disp: 180 tablet, Rfl: 1 .  meloxicam (MOBIC) 15 mg tablet, Take 1 tablet (15 mg total) by mouth daily., Disp: 30 tablet, Rfl: 0 .  methocarbamoL  (ROBAXIN ) 500 mg tablet, Take 500 mg by mouth 3 (three) times a day as needed., Disp: 90 tablet, Rfl: 0 .  montelukast  (SINGULAIR ) 10 mg tablet, TAKE 1 TABLET BY MOUTH DAILY, Disp: 90 tablet, Rfl: 3 .  multivitamin (THERAGRAN) tab tablet, Take 1 tablet by  mouth Once Daily., Disp: , Rfl:  .  predniSONE  (DELTASONE ) 10 mg tablet, Take 4 tablets by mouth for 4 days, 3 tablets by mouth for 4 days, 2 tablets by mouth for 4 days then 1 tablet daily for 4 days., Disp: 40 tablet, Rfl: 0 .  ProAir  HFA 90 mcg/actuation inhaler, Inhale 2 puffs every 6 (six) hours as needed., Disp: 18 g, Rfl: 5 .  promethazine  (PHENERGAN ) 12.5 mg tablet, Take 1 tablet (12.5 mg total) by mouth every 8 (eight) hours as needed for nausea., Disp: 30 tablet, Rfl: 1 .  Spiriva Respimat 1.25 mcg/actuation mist inhaler, INHALE 2 PUFFS BY MOUTH EVERY DAY, Disp: , Rfl:  .  topiramate  (Trokendi  XR) 200 mg 24 hr capsule, Take 1 capsule (200 mg total) by mouth daily., Disp: 90 capsule, Rfl: 1 .  valACYclovir (VALTREX) 1 gram tablet, TAKE ONE TABLET BY MOUTH EVERY 12 HOURS, Disp: 60 tablet, Rfl: 1 .  zolpidem  (AMBIEN ) 10 mg tablet, Take 10 mg by mouth nightly as needed., Disp: , Rfl: 3 .  dicyclomine (BENTYL) 10 mg capsule, Take 10 mg by mouth 3 (three) times a day for 30 days., Disp: 90 capsule, Rfl: 1  The following portions of the patient's history were reviewed and updated as appropriate: allergies, current medications, past medical history, past social history, past family history, and problem list.   ROS   See HPI.  OBJECTIVE  BP 131/75   Pulse 106   Temp 98.8 F (37.1 C)   Resp 16   Ht 1.524 m (5')   Wt 86.6 kg (191 lb)   SpO2 98%   BMI 37.30 kg/m   GENERAL APPEARANCE: Well appearing, well developed, no acute distress. HEENT:  Conjunctiva without erythema or drainage. External auditory canals are non-swollen without exudate present. Tympanic membranes are normal. Nasal turbinates are without edema or drainage. Oropharynx is without lesion or exudate.       NECK: Supple without lymphadenopathy, bruit, or thyromegaly. LUNGS: wheezing to left lower lung fields  HEART: Regular rate and rhythm without murmur. ABDOMEN: Normal bowel sounds, soft, non-tender, without  hepato-splenomegaly EXTREMITIES: No edema NEUROLOGIC: Alert and oriented x3, no obvious deficits.  SKIN:  No rashes or abnormal lesions visible.   ASSESSMENT/PLAN   1. Severe persistent asthma with (acute) exacerbation (Primary) Continue prednisone  and albuterol  inhalers as directed   2. Upper respiratory tract infection, unspecified type No improvement with zithromax Will start doxycyline 100 mg BID for 10 days  - doxycycline  (VIBRAMYCIN ) 100 mg capsule; Take 1 capsule (100 mg total) by mouth 2 (two) times a day for 10 days. Take with 8 oz water. Do not lie down for at least 30 minutes after.  Dispense: 20 capsule; Refill: 0  3. Acute cough Able to  tolerate hycodan Multiple interactions to medications prescribed, pt takes most medications prn  Will hold meds that cause interaction Discussed meds to hold  - HYDROcodone -homatropine (HYCODAN) 5-1.5 mg/5 mL syrp syrup; Take 5 mL by mouth every 6 (six) hours as needed (cough).  Dispense: 100 mL; Refill: 0    Counselled regarding condition(s) and questions answered.    Correct use and potential side effects of new medication(s) discussed.   Reviewed worrisome signs and symptoms to watch for and patient instructed to call or return to office for any concerns.   This document serves as a record of services personally performed by Daron Denise, FNP. It was created on their behalf by Renie LOISE Birmingham, CMA, a trained medical scribe, and Registered Medical Assistant (RMA). During the course of documenting the history, physical exam and medical decision making, I was functioning as a stage manager. The creation of this record is the provider's dictation and/or activities during the visit.                Electronically signed by: Renie LOISE Birmingham, CMA 05/28/2023 4:14 PM   I agree the documentation is accurate and complete.  Electronically signed by: Daron Denise Mace, NP 05/28/2023 4:18 PM

## 2023-05-28 NOTE — Telephone Encounter (Signed)
 error

## 2023-06-04 ENCOUNTER — Emergency Department (HOSPITAL_BASED_OUTPATIENT_CLINIC_OR_DEPARTMENT_OTHER): Payer: 59

## 2023-06-04 ENCOUNTER — Other Ambulatory Visit: Payer: Self-pay

## 2023-06-04 ENCOUNTER — Emergency Department (HOSPITAL_BASED_OUTPATIENT_CLINIC_OR_DEPARTMENT_OTHER)
Admission: EM | Admit: 2023-06-04 | Discharge: 2023-06-04 | Disposition: A | Discharge status: Home / Self Care | Payer: 59 | Attending: Emergency Medicine | Admitting: Emergency Medicine

## 2023-06-04 ENCOUNTER — Encounter (HOSPITAL_BASED_OUTPATIENT_CLINIC_OR_DEPARTMENT_OTHER): Payer: Self-pay

## 2023-06-04 DIAGNOSIS — Z20822 Contact with and (suspected) exposure to covid-19: Secondary | ICD-10-CM | POA: Diagnosis not present

## 2023-06-04 DIAGNOSIS — J4541 Moderate persistent asthma with (acute) exacerbation: Secondary | ICD-10-CM | POA: Diagnosis not present

## 2023-06-04 DIAGNOSIS — J210 Acute bronchiolitis due to respiratory syncytial virus: Secondary | ICD-10-CM | POA: Insufficient documentation

## 2023-06-04 DIAGNOSIS — R059 Cough, unspecified: Secondary | ICD-10-CM | POA: Diagnosis present

## 2023-06-04 LAB — CBC
HCT: 38.9 % (ref 36.0–46.0)
Hemoglobin: 12.6 g/dL (ref 12.0–15.0)
MCH: 31 pg (ref 26.0–34.0)
MCHC: 32.4 g/dL (ref 30.0–36.0)
MCV: 95.8 fL (ref 80.0–100.0)
Platelets: 236 10*3/uL (ref 150–400)
RBC: 4.06 MIL/uL (ref 3.87–5.11)
RDW: 13.2 % (ref 11.5–15.5)
WBC: 9 10*3/uL (ref 4.0–10.5)
nRBC: 0 % (ref 0.0–0.2)

## 2023-06-04 LAB — RESP PANEL BY RT-PCR (RSV, FLU A&B, COVID)  RVPGX2
Influenza A by PCR: NEGATIVE
Influenza B by PCR: NEGATIVE
Resp Syncytial Virus by PCR: POSITIVE — AB
SARS Coronavirus 2 by RT PCR: NEGATIVE

## 2023-06-04 LAB — BASIC METABOLIC PANEL
Anion gap: 9 (ref 5–15)
BUN: 12 mg/dL (ref 6–20)
CO2: 19 mmol/L — ABNORMAL LOW (ref 22–32)
Calcium: 9.5 mg/dL (ref 8.9–10.3)
Chloride: 108 mmol/L (ref 98–111)
Creatinine, Ser: 0.83 mg/dL (ref 0.44–1.00)
GFR, Estimated: >60 mL/min (ref >60)
Glucose, Bld: 109 mg/dL — ABNORMAL HIGH (ref 70–99)
Potassium: 3.9 mmol/L (ref 3.5–5.1)
Sodium: 136 mmol/L (ref 135–145)

## 2023-06-04 LAB — TROPONIN I (HIGH SENSITIVITY): Troponin I (High Sensitivity): 3 ng/L (ref <18)

## 2023-06-04 MED ORDER — ALBUTEROL SULFATE HFA 108 (90 BASE) MCG/ACT IN AERS
2.0000 | INHALATION_SPRAY | RESPIRATORY_TRACT | Status: DC | PRN
  Administered 2023-06-04: 2 via RESPIRATORY_TRACT
  Filled 2023-06-04 (×2): qty 6.7

## 2023-06-04 MED ORDER — METHYLPREDNISOLONE 4 MG PO TBPK: ORAL_TABLET | ORAL | 0 refills | Status: DC

## 2023-06-04 MED ORDER — HYDROCODONE BIT-HOMATROP MBR 5-1.5 MG/5ML PO SOLN: 5.0000 mL | Freq: Four times a day (QID) | ORAL | 0 refills | Status: DC | PRN

## 2023-06-04 MED ORDER — PREDNISONE 50 MG PO TABS
60.0000 mg | ORAL_TABLET | Freq: Once | ORAL | Status: AC
  Administered 2023-06-04: 60 mg via ORAL
  Filled 2023-06-04: qty 1

## 2023-06-04 MED ORDER — ALBUTEROL SULFATE (5 MG/ML) 0.5% IN NEBU
5.0000 mg | INHALATION_SOLUTION | Freq: Four times a day (QID) | RESPIRATORY_TRACT | 0 refills | Status: AC | PRN
Start: 2023-06-04 — End: ?

## 2023-06-04 NOTE — ED Notes (Signed)
Patient took 3 puffs of personal albuterol MDI.  Describes shortness of breath as "tight" BBS decreased, Sats 100% on room air  06/04/23 1751  Respiratory Assessment  Assessment Type Pre-treatment  Respiratory Pattern Regular;Unlabored;Symmetrical;Dyspnea with exertion  Chest Assessment Chest expansion symmetrical  Cough Congested  Bilateral Breath Sounds Diminished  Oxygen Therapy/Pulse Ox  O2 Therapy Room air  SpO2 100 %

## 2023-06-04 NOTE — Discharge Instructions (Signed)
As we discussed, you have RSV that likely triggered your asthma  I recommend you do Medrol Dosepak as prescribed  I also recommend that you take Hycodan as needed  Please use albuterol every 4 hours as needed for cough or wheezing  See your doctor for follow-up  Return to ER if you have worse shortness of breath or cough or trouble breathing

## 2023-06-04 NOTE — ED Provider Notes (Signed)
[facility] [facility] Provider Note   CSN: [id number] Arrival date & time: 06/04/23  1734     History  Chief Complaint  Patient presents with   Shortness of Breath   Chest Pain    Alexis Hall is a 53 y.o. female history of asthma here presenting with cough.  Patient went to urgent care about a week ago for cough.  Patient states that she was prescribed prednisone taper and also Hycodan and albuterol.  She states that she was feeling better and tried to go back to work today and but then felt very short of breath.  Patient was noted to have some mild wheezing in triage she was given albuterol.  Patient states that she is feeling better now.  The history is provided by the patient.       Home Medications Prior to Admission medications   Medication Sig Start Date End Date Taking? Authorizing Provider  albuterol (PROVENTIL) (5 MG/ML) 0.5% nebulizer solution Take 1 mL (5 mg total) by nebulization every 6 (six) hours as needed for wheezing or shortness of breath. 06/04/23  Yes Charlynne Pander, MD  HYDROcodone bit-homatropine (HYCODAN) 5-1.5 MG/5ML syrup Take 5 mLs by mouth every 6 (six) hours as needed for cough. 06/04/23  Yes Charlynne Pander, MD  methylPREDNISolone (MEDROL DOSEPAK) 4 MG TBPK tablet Use as directed 06/04/23  Yes Charlynne Pander, MD  Albuterol-Budesonide [facility]) 90-80 MCG/ACT AERO Inhale 2 puffs into the lungs 4 (four) times daily as needed. 07/30/22   Nyoka Cowden, MD  amLODipine (NORVASC) 5 MG tablet Take by mouth. 06/07/22   [provider]  ARIPiprazole (ABILIFY) 2 MG tablet Take 3 mg by mouth daily.    [provider]  budesonide-formoterol (SYMBICORT) 160-4.5 MCG/ACT inhaler Inhale 2 puffs into the lungs 2 (two) times daily.    [provider]  buPROPion (WELLBUTRIN XL) 150 MG 24 hr tablet Take 150 mg by mouth daily.    [provider]  Cholecalciferol 50 MCG (2000 UT) CAPS Take 1  capsule by mouth daily. 04/16/16   [provider]  cyproheptadine (PERIACTIN) 4 MG tablet Take 4 mg by mouth 2 (two) times daily.    [provider]  DULoxetine (CYMBALTA) 60 MG capsule  03/04/22   [provider]  esomeprazole (NEXIUM) 40 MG capsule Take by mouth. 03/01/22   [provider]  fluticasone (FLONASE) 50 MCG/ACT nasal spray Place 2 sprays into both nostrils daily.    [provider]  hydrocortisone (ANUSOL-HC) 2.5 % rectal cream Place 1 Application rectally 2 (two) times daily. 05/18/23   Rancour, Jeannett Senior, MD  montelukast (SINGULAIR) 10 MG tablet Take 10 mg by mouth at bedtime.    [provider]  polyethylene glycol (MIRALAX) 17 g packet Take 17 g by mouth daily. 05/18/23   Rancour, Jeannett Senior, MD  predniSONE (DELTASONE) 5 MG tablet Take 1 tablet by mouth daily. 03/19/21   [provider]  TEZSPIRE 210 MG/1. syringe  08/29/21   [provider]  tiotropium (SPIRIVA) 18 MCG inhalation capsule Place 18 mcg into inhaler and inhale daily.    [provider]  Topiramate ER (TROKENDI XR) 100 MG CP24 Take 1 capsule by mouth daily.    [provider]  valACYclovir (VALTREX) 1000 MG tablet  02/13/22   [provider]  zolpidem (AMBIEN) 5 MG tablet Take 5 mg by mouth at bedtime as needed for sleep.    [provider]  Allergies    Carbamazepine, Hydrocodone-acetaminophen, Lamictal [lamotrigine], and Fluconazole    Review of Systems   Review of Systems  Respiratory:  Positive for shortness of breath.   Cardiovascular:  Positive for chest pain.  All other systems reviewed and are negative.   Physical Exam Updated Vital Signs BP 133/89 (BP Location: Left Arm)   Pulse (!) 112   Temp 97.7 F (36.5 C)   Resp (!) 22   Ht 5\' 2"  (1.575 m)   Wt 84.8 kg   LMP 07/23/2016 (Exact Date)   SpO2 100%   BMI 34.20 kg/m  Physical Exam Vitals and nursing note reviewed.  Constitutional:       Comments: Slightly tachypneic  HENT:     Head: Normocephalic.     Mouth/Throat:     Mouth: Mucous membranes are moist.  Eyes:     Extraocular Movements: Extraocular movements intact.     Pupils: Pupils are equal, round, and reactive to light.  Cardiovascular:     Rate and Rhythm: Normal rate and regular rhythm.  Pulmonary:     Comments: Diminished bilaterally and no obvious wheezing Abdominal:     Palpations: Abdomen is soft.  Musculoskeletal:        General: Normal range of motion.     Cervical back: Normal range of motion and neck supple.  Skin:    General: Skin is warm.     Capillary Refill: Capillary refill takes less than 2 seconds.  Neurological:     General: No focal deficit present.  Psychiatric:        Mood and Affect: Mood normal.     ED Results / Procedures / Treatments   Labs (all labs ordered are listed, but only abnormal results are displayed) Labs Reviewed  RESP PANEL BY RT-PCR (RSV, FLU A&B, COVID)  RVPGX2 - Abnormal; Notable for the following components:      Result Value   Resp Syncytial Virus by PCR POSITIVE (*)    All other components within normal limits  BASIC METABOLIC PANEL - Abnormal; Notable for the following components:   CO2 19 (*)    Glucose, Bld 109 (*)    All other components within normal limits  CBC  TROPONIN I (HIGH SENSITIVITY)  TROPONIN I (HIGH SENSITIVITY)    EKG None  Radiology DG Chest 2 View Result Date: 06/04/2023 CLINICAL DATA:  Cough, shortness of breath and chest pain. EXAM: CHEST - 2 VIEW COMPARISON:  June 02, 2023 FINDINGS: The heart size and mediastinal contours are within normal limits. Low lung volumes are noted. Both lungs are clear. Radiopaque surgical clips are seen within the right upper quadrant. The visualized skeletal structures are unremarkable. IMPRESSION: No active cardiopulmonary disease. Electronically Signed   By: Aram Candela M.D.   On: 06/04/2023 18:16    Procedures Procedures     Medications Ordered in ED Medications  albuterol (VENTOLIN HFA) 108 (90 Base) MCG/ACT inhaler 2 puff (has no administration in time range)  predniSONE (DELTASONE) tablet 60 mg (has no administration in time range)    ED Course/ Medical Decision Making/ A&P                                 Medical Decision Making Chona Moothart is a 53 y.o. female here presenting with cough.  Patient has history of asthma and has been coughing.  Patient is finishing a steroid taper.  Patient has been diminished breath  sound bilaterally.  Patient has no wheezing currently.  Patient is positive for RSV.  Reviewed her labs and they were unremarkable, including troponin.  I think she likely has RSV that triggered her asthma.  Will give another steroid taper and refill her albuterol   Problems Addressed: Moderate persistent asthma with acute exacerbation: acute illness or injury RSV bronchiolitis: acute illness or injury  Amount and/or Complexity of Data Reviewed Labs: ordered. Decision-making details documented in ED Course. Radiology: ordered and independent interpretation performed. Decision-making details documented in ED Course.  Risk Prescription drug management.    Final Clinical Impression(s) / ED Diagnoses Final diagnoses:  None    Rx / DC Orders ED Discharge Orders          Ordered    methylPREDNISolone (MEDROL DOSEPAK) 4 MG TBPK tablet        06/04/23 1948    albuterol (PROVENTIL) (5 MG/ML) 0.5% nebulizer solution  Every 6 hours PRN        06/04/23 1948    HYDROcodone bit-homatropine (HYCODAN) 5-1.5 MG/5ML syrup  Every 6 hours PRN        06/04/23 1950              Charlynne Pander, MD 06/04/23 [phone]

## 2023-06-04 NOTE — ED Triage Notes (Signed)
Pt c/o cough, shortness of breath & chest pain. Pt reports s/s have been going on for about a week but getting worse. Pt was seen at [facility] on Monday and told to come to ED.

## 2024-01-07 NOTE — H&P (Signed)
 ------------------------------------------------------------------------------- Attestation signed by Ozell Frederic Specking, MD at 01/07/2024 10:43 AM Agree with plan below -------------------------------------------------------------------------------    Chief Complaint: Bone marrow biopsy  Referring Physician(s): Dr. Kaylene  Supervising Physician: Dr. Specking  Patient Status: [facility]- Outpt  History of Present Illness: Alexis Hall is a 53 y.o. female being worked up for monoclonal gammopathy.  She is referred for bone marrow biopsy.  PMHx, meds, labs, imaging, and allergies reviewed. Feels well, no recent c/o fevers, chills, illness. Has been NPO appropriately.   Medical History[1]  Surgical History[2]  Family History[3]  Social History[4]  Allergies: Carbamazepine, Fluconazole, Hydrocodone , Lamotrigine, and Hydrocodone -acetaminophen   Medications: Prior to Admission medications  Medication Sig Start Date End Date Taking? Authorizing Provider  acetaminophen  (TYLENOL ) 500 mg tablet Take 500 mg by mouth every 6 (six) hours as needed for mild pain (1-3).   Yes HISTORICAL PROVIDER, CONVERSION  albuterol  HFA (PROVENTIL  HFA;VENTOLIN  HFA;PROAIR  HFA) 90 mcg/actuation inhaler Inhale 2 puffs every 6 (six) hours as needed for wheezing. 01/02/24 06/30/24 Yes Reyes MALVA Piles, FNP  amLODIPine (NORVASC) 5 mg tablet TAKE ONE (1) TABLET BY MOUTH EVERY DAY 10/31/23  Yes Norleen Lemond Ferrier, MD  amoxicillin-pot clavulanate (AUGMENTIN) 875-125 mg per tablet Take 1 tablet by mouth 2 (two) times a day for 10 days. 01/01/24 01/11/24 Yes Morene Eck Roddy, FNP  ARIPiprazole (ABILIFY) 10 mg tablet Take 10 mg by mouth daily. 09/25/23  Yes HISTORICAL PROVIDER, CONVERSION  budesonide-formoteroL (SYMBICORT;BREYNA) 160-4.5 mcg/actuation inhaler Inhale 2 puffs 2 (two) times a day. 01/02/24  Yes Reyes MALVA Piles, FNP  buPROPion (WELLBUTRIN XL) 150 mg 24 hr tablet Take 150 mg by mouth in the morning.  03/07/23  Yes HISTORICAL PROVIDER, CONVERSION  cetirizine (ZyrTEC) 10 mg tablet Take 10 mg by mouth daily.   Yes HISTORICAL PROVIDER, CONVERSION  esomeprazole (NexIUM) 40 mg DR capsule Take 1 capsule (40 mg total) by mouth before breakfast and before evening meal. 07/10/23  Yes Michelle Dawn Hilliard, PA-C  fluticasone propionate (FLONASE) 50 mcg/spray nasal spray SHAKE LIQUID AND USE 2 SPRAYS IN EACH NOSTRIL DAILY 11/05/16  Yes Norleen Lemond Ferrier, MD  frovatriptan 2.5 mg tab TAKE 1 TABLET BY MOUTH AT EARLIEST ONSETOF HEADACHE. MAY REPEAT IN 2 HOURS IF NEEDED. DO NOT EXCEED MORE THAN 2 TABLETS IN 24 HOURS. 02/24/23  Yes Lirim Tonuzi, MD  ibuprofen (MOTRIN) 800 mg tablet Take 1 tablet (800 mg total) by mouth every 8 (eight) hours as needed for mild pain (1-3) (for pain). 06/12/23  Yes Le'Starr Marko Ee, FNP  ketoconazole (NIZORAL) 2 % cream Apply 1 Application topically daily. 08/07/22  Yes HISTORICAL PROVIDER, CONVERSION  levETIRAcetam (KEPPRA) 500 mg tablet TAKE 1 TABLET BY MOUTH TWICE DAILY 09/29/23  Yes Lirim Tonuzi, MD  multivitamin (THERAGRAN) tab tablet Take 1 tablet by mouth daily. 04/16/16  Yes HISTORICAL PROVIDER, CONVERSION  albuterol  2.5 mg /3 mL (0.083 %) nebulizer solution Take 2.5 mg by nebulization every 4 (four) hours as needed for wheezing or shortness of breath. 11/30/21   Norleen Lemond Ferrier, MD  benralizumab [facility]) 30 mg/mL atIn auto-injector Inject 1 mL (30 mg total) under the skin every 8 (eight) weeks. 04/14/23   Reyes MALVA Piles, FNP  benzonatate (TESSALON) 200 mg capsule Take 1 capsule (200 mg total) by mouth 3 (three) times a day as needed for cough. 01/01/24   Morene Eck Roddy, FNP  carboxymethylce-glycern-poly80 (Refresh Optive Advanced) 0.5-1-0.5 % drop  12/09/23   HISTORICAL PROVIDER, CONVERSION  cholecalciferol (VITAMIN D3) 2,000 unit cap capsule Take  1 capsule by mouth daily. 04/16/16   HISTORICAL PROVIDER, CONVERSION  clonazePAM (KlonoPIN) 0.5 mg tablet Take 0.5 mg by  mouth daily. As needed for severe anxiety 03/07/23   HISTORICAL PROVIDER, CONVERSION  cyproheptadine (PERIACTIN) 4 mg tablet TAKE 1/2 TABLET BY MOUTH TWICE DAILY 09/29/23   Lirim Tonuzi, MD  desonide (DESOWEN) 0.05 % oint ointment Apply 1 Application topically daily. 10/02/23   HISTORICAL PROVIDER, CONVERSION  dicyclomine (BENTYL) 10 mg capsule Take 1 capsule (10 mg total) by mouth 3 (three) times a day as needed (abd pain). 07/10/23 01/01/24  Michelle Dawn Hilliard, PA-C  DULoxetine (CYMBALTA) 60 mg capsule Take 1 capsule (60 mg total) by mouth 2 (two) times a day. 11/20/23   Lirim Tonuzi, MD  EpiPen 2-Pak 0.3 mg/0.3 mL injection syringe  07/02/15   HISTORICAL PROVIDER, CONVERSION  hydrocortisone  (ANUSOL -HC) 25 mg suppository Insert 1 suppository (25 mg total) into the rectum 2 (two) times a day. 11/13/23   Michelle Dawn Hilliard, PA-C  hydroxychloroquine (PLAQUENIL) 200 mg tablet Take 2 tablets (400 mg total) by mouth daily. 11/04/23   Alm Morene Latina, MD  methocarbamoL  (ROBAXIN ) 500 mg tablet Take 500 mg by mouth 3 (three) times a day as needed. 06/13/21   Lirim Tonuzi, MD  miscellaneous medical supply misc Please provide nebulizer tubing 06/09/23   Le'Starr Marko Ee, FNP  montelukast (SINGULAIR) 10 mg tablet TAKE 1 TABLET BY MOUTH DAILY 04/22/19   Norleen Lemond Ferrier, MD  potassium chloride 20 mEq ER tablet Take 1 tablet (20 mEq total) by mouth daily for 7 days. Patient not taking: Reported on 01/02/2024 12/25/23 01/01/24  Aida JONELLE Lunger, MD  predniSONE  (DELTASONE ) 10 mg tablet Take 6 tablets PO day 1, 5 tablets day 2, 4 tablets day 3, 3 tablets day 4, 2 tablets day 5, 1 tablet day 6 01/01/24   Morene Eck Roddy, FNP  ProAir  HFA 90 mcg/actuation inhaler Inhale two puffs every 6 (six) hours as needed for shortness of breath or wheezing. 09/22/23   Reyes MALVA Piles, FNP  promethazine (PHENERGAN) 12.5 mg tablet Take 1 tablet (12.5 mg total) by mouth every 8 (eight) hours as needed for nausea. 09/29/23    Lirim Tonuzi, MD  sodium chloride  (Muro 128) 5 % oint ophthalmic ointment  12/10/23   HISTORICAL PROVIDER, CONVERSION  Spiriva Respimat 1.25 mcg/actuation mist inhaler Inhale 2 puffs in the morning. 01/02/24   Reyes MALVA Piles, FNP  tacrolimus (PROTOPIC) 0.1 % oint ointment Apply 1 Application topically 2 (two) times a day. 10/03/23   HISTORICAL PROVIDER, CONVERSION  topiramate (Trokendi XR) 200 mg 24 hr capsule Take 1 capsule (200 mg total) by mouth daily. 09/29/23   Lirim Tonuzi, MD  valACYclovir (VALTREX) 1 gram tablet TAKE ONE TABLET BY MOUTH EVERY 12 HOURS 11/21/22   Christina Earnie Bucco, PA-C  zolpidem (AMBIEN) 10 mg tablet Take 10 mg by mouth nightly as needed. 06/02/15   HISTORICAL PROVIDER, CONVERSION    Review of Systems: A 12 point ROS discussed and pertinent positives are indicated in the HPI above.  All other systems are negative.  Review of Systems  Vital Signs: Vitals:   01/07/24 0900  BP: 136/74  Pulse: 89  Resp: 15  Temp: 97.1 F (36.2 C)  SpO2: 98%   There is no height or weight on file to calculate BMI.  Physical Exam HENT:     Mouth/Throat:     Mouth: Mucous membranes are moist.     Pharynx: Oropharynx is clear.  Cardiovascular:  Rate and Rhythm: Normal rate and regular rhythm.     Heart sounds: Normal heart sounds.  Pulmonary:     Effort: Pulmonary effort is normal. No respiratory distress.     Breath sounds: Normal breath sounds.  Skin:    General: Skin is warm and dry.  Neurological:     General: No focal deficit present.     Mental Status: She is alert and oriented to person, place, and time.  Psychiatric:        Mood and Affect: Mood normal.        Thought Content: Thought content normal.      Labs: Results:  Comprehensive Oncology Labs:  Oncology Labs       12/23/2023   11:36 12/12/2023   09:02  Labs - Hematology  WBC 6.41  4.90   RBC 3.96*  3.91*   Hemoglobin 12.5  12.8   Hematocrit 37.6  37.1   Mean Corpuscular Volume (MCV) 95.1   94.8   MCH 31.5  32.6   Mean Corpuscular Hemoglobin Conc (MCHC) 33.1  34.4   Red Cell Distribution Width (RDW-CV) 14.0  13.7   Platelet Count (Plt) 197  199   Mean Platelet Volume (MPV) 8.2  8.2   Neutrophils Absolute 3.80  2.60   Neutrophils % 60  54   Lymphocytes Absolute 2.00  1.70   Lymphocytes % 31  35   Monocytes Absolute 0.60  0.50   Monocytes % 9  11   Basophils Absolute 0.00  0.00   Basophils % 0  0   Eosinophils Absolute 0.00  0.00   Eosinophils % 0  0   NRBC Absolute  0.00   Gamma Globulin Serum 0.9    Labs - General Chemistry  Sodium 141    Potassium 3.3*    Chloride 112*    CO2 24    Blood Urea Nitrogen 15    Creatinine 0.95  0.89   Glucose 109*    Calcium Level Total 9.2    Albumin 4.1  4.0   Bilirubin Total 0.3  0.3   Bilirubin, Direct  0.0   Alk Phos Total 57  54   Aspartate Aminotransferase (AST) 13  13   Alanine Aminotransferase 18  13   Labs - Liver Function  Bilirubin Total 0.3  0.3   Bilirubin, Direct  0.0   Alk Phos Total 57  54   Alanine Aminotransferase 18  13   Aspartate Aminotransferase (AST) 13  13   Labs - Special Chemistry  SPE Interpretation M SPIKE IN THE MID GAMMA REGION, ESTIMATED AT 0.55 G/DL.    Immunoelectrophoresis (IFE), Serum MONOCLONAL IGG LAMBDA.    Albumin Serum EP 3.8    Alpha 1 Serum 0.3    Alpha 2 Serum 0.7    Immunoglobulin G, Quantitative 963    Immunoglobulin A, Quantitative (IgA) 63*    Immunoglobulin M, Quantitative (IgM) 56    Free Kappa Light Chains, Serum 5.26    Free Lambda Light Chains, Serum 8.30    Kappa/Lambda Ratio, Serum 0.63    Tumor Markers  Total Protein 6.5    6.3*  6.3*   Free Kappa Light Chains, Serum 5.26    Free Lambda Light Chains, Serum 8.30    Immunoglobulin M, Quantitative (IgM) 56    SPE Interpretation M SPIKE IN THE MID GAMMA REGION, ESTIMATED AT 0.55 G/DL.       Mallampati Class:   I (soft palate, uvula, fauces, and tonsillar pillars  visible)  ASA Grade: ASA 2 - Patient with mild  systemic disease with no functional limitations     Assessment and Plan: MGUS For image guided bone marrow biopsy Risks and benefits of bone marrow biopsy was discussed with the patient including, but not limited to bleeding, infection, damage to adjacent structures or low yield requiring additional tests.  The patient has been informed how conscious sedation is performed and understands that all sedation medications involve risks of complications and serious possible damage to vital organs such as the brain, heart, lung, liver, and kidney, and that in some cases use of these medications may result in cardiac arrest, and/or brain death from both known and unknown causes. The patient has been informed that, during the course of the conscious sedation, unforeseen conditions may necessitate additional or different procedures than set forth above.   All of the patient's questions were answered, patient is agreeable to proceed.  Consent signed and in chart.     Signed: Franky Rusk PA-C Interventional Radiology 10:26 AM     I spent a total of 20 minutes in face to face in clinical consultation, greater than 50% of which was counseling/coordinating care for bone marrow biopsy       [1] Past Medical History: Diagnosis Date  . Allergic rhinitis   . Allergy   . Asthma (CMD)    no recent problems, Albuterol  PRN  . Asthma, extrinsic (CMD)   . Asthma, intrinsic (CMD)   . Bronchiectasis    (CMD)   . Cataract   . Colon polyp   . Depression   . GERD (gastroesophageal reflux disease)   . Glaucoma suspect of both eyes   . Hypertension    during pregnancy, not currently treated  . Infertility management    2 failed IVF cycles  . Intermittent low back pain   . Migraine   . Primary central sleep apnea   . Sleep apnea, obstructive   [2] Past Surgical History: Procedure Laterality Date  . BREAST BIOPSY Right    Procedure: BREAST BIOPSY; 2013, bg  . CESAREAN SECTION, UNSPECIFIED      Procedure: CESAREAN SECTION; x1  . CHOLECYSTECTOMY     Procedure: CHOLECYSTECTOMY  . COLONOSCOPY N/A 02/18/2018   Procedure: COLONOSCOPY;  Surgeon: Gunnar Chris Daniels, MD;  Location: [facility];  Service: Gastroenterology;  Laterality: N/A;  . COLONOSCOPY N/A 09/21/2019   Procedure: COLONOSCOPY;  Surgeon: Gunnar Chris Daniels, MD;  Location: [facility];  Service: Gastroenterology;  Laterality: N/A;  . COLONOSCOPY W/ POLYPECTOMY     Procedure: COLONOSCOPY W/ POLYPECTOMY  . ENDOMETRIAL ABLATION     Procedure: ENDOMETRIAL ABLATION  . ESOPHAGOGASTRODUODENOSCOPY N/A 09/21/2019   Procedure: EGD;  Surgeon: Gunnar Chris Daniels, MD;  Location: [facility];  Service: Gastroenterology;  Laterality: N/A;  . ESSURE TUBAL LIGATION     Procedure: ESSURE TUBAL LIGATION  . FOOT SURGERY Left    Procedure: FOOT SURGERY; tarsal tunnel release  . OTHER SURGICAL HISTORY Left    Procedure: OTHER SURGICAL HISTORY (dental implant)  . PELVIC LAPAROSCOPY     Procedure: PELVIC LAPAROSCOPY  . REDUCTION MAMMAPLASTY     Procedure: REDUCTION MAMMAPLASTY  [3] Family History Problem Relation Name Age of Onset  . Rheum arthritis Mother    . Diabetes Mother         Pre-diabetes  . Hypertension Mother    . Hypothyroidism Mother    . Glaucoma Mother    . Hyperlipidemia Father    .  Hypertension Father    . Colon polyps Father    . Prostate cancer Father    . Deep vein thrombosis Sister    . Pulmonary disease Sister    . Clotting disorder Sister    . Asthma Brother    . Hypertension Brother         no treatment  . Asthma Son Honora   . Breast cancer Maternal Aunt    . Lymphoma Maternal Aunt    . Macular degeneration Neg Hx    [4] Social History Socioeconomic History  . Marital status: Married  Tobacco Use  . Smoking status: Never    Passive exposure: Never  . Smokeless tobacco: Never  Vaping Use  . Vaping status: Never Used  Substance and Sexual Activity  . Alcohol use: No  . Drug use: No  . Sexual  activity: Yes    Partners: Male    Birth control/protection: Surgical    Comment: ESSURE 04/19/11   Social Drivers of Health   Food Insecurity: Low Risk  (01/01/2024)   Food vital sign   . Within the past 12 months, you worried that your food would run out before you got money to buy more: Never true   . Within the past 12 months, the food you bought just didn't last and you didn't have money to get more: Never true  Transportation Needs: No Transportation Needs (01/01/2024)   Transportation   . In the past 12 months, has lack of reliable transportation kept you from medical appointments, meetings, work or from getting things needed for daily living? : No  Safety: Low Risk  (01/02/2024)   Safety   . How often does anyone, including family and friends, physically hurt you?: Never   . How often does anyone, including family and friends, insult or talk down to you?: Never   . How often does anyone, including family and friends, threaten you with harm?: Never   . How often does anyone, including family and friends, scream or curse at you?: Never  Living Situation: Low Risk  (01/01/2024)   Living Situation   . What is your living situation today?: I have a steady place to live   . Think about the place you live. Do you have problems with any of the following? Choose all that apply:: None/None on this list  *Some images could not be shown.

## 2024-02-07 ENCOUNTER — Other Ambulatory Visit: Payer: Self-pay

## 2024-02-07 ENCOUNTER — Encounter (HOSPITAL_BASED_OUTPATIENT_CLINIC_OR_DEPARTMENT_OTHER): Payer: Self-pay | Admitting: Emergency Medicine

## 2024-02-07 ENCOUNTER — Inpatient Hospital Stay (HOSPITAL_BASED_OUTPATIENT_CLINIC_OR_DEPARTMENT_OTHER)
Admission: EM | Admit: 2024-02-07 | Discharge: 2024-02-10 | DRG: 379 | Disposition: A | Discharge status: Home / Self Care | Attending: Internal Medicine | Admitting: Internal Medicine

## 2024-02-07 ENCOUNTER — Emergency Department (HOSPITAL_BASED_OUTPATIENT_CLINIC_OR_DEPARTMENT_OTHER)

## 2024-02-07 DIAGNOSIS — I10 Essential (primary) hypertension: Secondary | ICD-10-CM | POA: Diagnosis present

## 2024-02-07 DIAGNOSIS — Z8261 Family history of arthritis: Secondary | ICD-10-CM

## 2024-02-07 DIAGNOSIS — K625 Hemorrhage of anus and rectum: Secondary | ICD-10-CM

## 2024-02-07 DIAGNOSIS — K5731 Diverticulosis of large intestine without perforation or abscess with bleeding: Principal | ICD-10-CM | POA: Diagnosis present

## 2024-02-07 DIAGNOSIS — Z825 Family history of asthma and other chronic lower respiratory diseases: Secondary | ICD-10-CM

## 2024-02-07 DIAGNOSIS — G40909 Epilepsy, unspecified, not intractable, without status epilepticus: Secondary | ICD-10-CM | POA: Diagnosis present

## 2024-02-07 DIAGNOSIS — Z860100 Personal history of colon polyps, unspecified: Secondary | ICD-10-CM

## 2024-02-07 DIAGNOSIS — Z885 Allergy status to narcotic agent status: Secondary | ICD-10-CM

## 2024-02-07 DIAGNOSIS — K922 Gastrointestinal hemorrhage, unspecified: Secondary | ICD-10-CM | POA: Diagnosis not present

## 2024-02-07 DIAGNOSIS — K648 Other hemorrhoids: Secondary | ICD-10-CM | POA: Diagnosis present

## 2024-02-07 DIAGNOSIS — J4540 Moderate persistent asthma, uncomplicated: Secondary | ICD-10-CM | POA: Diagnosis present

## 2024-02-07 DIAGNOSIS — Z803 Family history of malignant neoplasm of breast: Secondary | ICD-10-CM

## 2024-02-07 DIAGNOSIS — Z7951 Long term (current) use of inhaled steroids: Secondary | ICD-10-CM

## 2024-02-07 DIAGNOSIS — Z888 Allergy status to other drugs, medicaments and biological substances status: Secondary | ICD-10-CM

## 2024-02-07 DIAGNOSIS — M069 Rheumatoid arthritis, unspecified: Secondary | ICD-10-CM | POA: Diagnosis present

## 2024-02-07 DIAGNOSIS — Z7952 Long term (current) use of systemic steroids: Secondary | ICD-10-CM

## 2024-02-07 DIAGNOSIS — G43909 Migraine, unspecified, not intractable, without status migrainosus: Secondary | ICD-10-CM | POA: Diagnosis present

## 2024-02-07 DIAGNOSIS — R110 Nausea: Secondary | ICD-10-CM | POA: Diagnosis present

## 2024-02-07 DIAGNOSIS — Z79899 Other long term (current) drug therapy: Secondary | ICD-10-CM

## 2024-02-07 DIAGNOSIS — F419 Anxiety disorder, unspecified: Secondary | ICD-10-CM | POA: Diagnosis present

## 2024-02-07 DIAGNOSIS — K921 Melena: Principal | ICD-10-CM | POA: Diagnosis present

## 2024-02-07 HISTORY — DX: Monoclonal gammopathy: D47.2

## 2024-02-07 HISTORY — DX: Rheumatoid arthritis, unspecified: M06.9

## 2024-02-07 LAB — CBC WITH DIFFERENTIAL/PLATELET
Abs Immature Granulocytes: 0.01 K/uL (ref 0.00–0.07)
Basophils Absolute: 0 K/uL (ref 0.0–0.1)
Basophils Relative: 0 %
Eosinophils Absolute: 0 K/uL (ref 0.0–0.5)
Eosinophils Relative: 0 %
HCT: 36.9 % (ref 36.0–46.0)
Hemoglobin: 11.9 g/dL — ABNORMAL LOW (ref 12.0–15.0)
Immature Granulocytes: 0 %
Lymphocytes Relative: 36 %
Lymphs Abs: 2 K/uL (ref 0.7–4.0)
MCH: 31 pg (ref 26.0–34.0)
MCHC: 32.2 g/dL (ref 30.0–36.0)
MCV: 96.1 fL (ref 80.0–100.0)
Monocytes Absolute: 0.6 K/uL (ref 0.1–1.0)
Monocytes Relative: 11 %
Neutro Abs: 2.8 K/uL (ref 1.7–7.7)
Neutrophils Relative %: 53 %
Platelets: 186 K/uL (ref 150–400)
RBC: 3.84 MIL/uL — ABNORMAL LOW (ref 3.87–5.11)
RDW: 13.6 % (ref 11.5–15.5)
WBC: 5.4 K/uL (ref 4.0–10.5)
nRBC: 0 % (ref 0.0–0.2)

## 2024-02-07 LAB — COMPREHENSIVE METABOLIC PANEL WITH GFR
ALT: 29 U/L (ref 0–44)
AST: 21 U/L (ref 15–41)
Albumin: 4.2 g/dL (ref 3.5–5.0)
Alkaline Phosphatase: 66 U/L (ref 38–126)
Anion gap: 9 (ref 5–15)
BUN: 11 mg/dL (ref 6–20)
CO2: 21 mmol/L — ABNORMAL LOW (ref 22–32)
Calcium: 9.1 mg/dL (ref 8.9–10.3)
Chloride: 110 mmol/L (ref 98–111)
Creatinine, Ser: 0.82 mg/dL (ref 0.44–1.00)
GFR, Estimated: >60 mL/min (ref >60)
Glucose, Bld: 92 mg/dL (ref 70–99)
Potassium: 4.1 mmol/L (ref 3.5–5.1)
Sodium: 140 mmol/L (ref 135–145)
Total Bilirubin: 0.2 mg/dL (ref 0.0–1.2)
Total Protein: 6.3 g/dL — ABNORMAL LOW (ref 6.5–8.1)

## 2024-02-07 LAB — URINALYSIS, ROUTINE W REFLEX MICROSCOPIC
Bilirubin Urine: NEGATIVE
Glucose, UA: NEGATIVE mg/dL
Hgb urine dipstick: NEGATIVE
Ketones, ur: NEGATIVE mg/dL
Nitrite: NEGATIVE
Protein, ur: NEGATIVE mg/dL
Specific Gravity, Urine: 1.015 (ref 1.005–1.030)
pH: 6.5 (ref 5.0–8.0)

## 2024-02-07 LAB — URINALYSIS, MICROSCOPIC (REFLEX)

## 2024-02-07 LAB — LIPASE, BLOOD: Lipase: 28 U/L (ref 11–51)

## 2024-02-07 LAB — OCCULT BLOOD X 1 CARD TO LAB, STOOL: Fecal Occult Bld: POSITIVE — AB

## 2024-02-07 MED ORDER — IOHEXOL 350 MG/ML SOLN
75.0000 mL | Freq: Once | INTRAVENOUS | Status: AC | PRN
Start: 2024-02-07 — End: 2024-02-07
  Administered 2024-02-07: 75 mL via INTRAVENOUS

## 2024-02-07 MED ORDER — FENTANYL CITRATE PF 50 MCG/ML IJ SOSY
50.0000 ug | PREFILLED_SYRINGE | Freq: Once | INTRAMUSCULAR | Status: AC
  Administered 2024-02-07: 50 ug via INTRAVENOUS
  Filled 2024-02-07: qty 1

## 2024-02-07 NOTE — ED Triage Notes (Signed)
 Pt reports rectal bleeding and LLQ pain; was evaluated 2 days ago for hemorrhoids and rectal bleeding, but did not have the abd pain at that time; +nausea

## 2024-02-07 NOTE — Progress Notes (Signed)
 Location:[facility] EDP: [place] Dx: brb per rectum /abdominal pain  HPI:  Patient has brb rectum x few days but worse to night, does endorse some abdominal pain . Denies blood thinners per EDP PMH RA Diverticulosis  Asthma HTN  Eval -Ctbleed - no active bleed  Hgb 11.9 Fob +  BP 130/78 hr 70   Consult GI: Dianna Ee  Rec admit for observation  Status-admit to [facility], patient will continue under care of ED MD until patient arrives at [facility]

## 2024-02-07 NOTE — ED Provider Notes (Signed)
 [place] [facility] AT [facility] Provider Note   CSN: [id number] Arrival date & time: 02/07/24  1947     Patient presents with: Abdominal Pain and Rectal Bleeding   Karina Nofsinger is a 53 y.o. female.   Patient here with 2 episodes of bright red blood from her rectum with some lower abdominal pain.  She started to notice some scant bleeding on toilet paper few days ago but got heavier tonight with 2 episodes of large bright red blood per rectum that started about an hour ago and then 30 minutes ago.  She is on any blood thinners.  She has a history of diverticulosis polyps hemorrhoids.  History of hypertension and migraines.  She is recently being worked up for multiple myeloma but workup was unremarkable.  She is being monitored for this.  The history is provided by the patient.       Prior to Admission medications   Medication Sig Start Date End Date Taking? Authorizing Provider  albuterol  (PROVENTIL ) (5 MG/ML) 0.5% nebulizer solution Take 1 mL (5 mg total) by nebulization every 6 (six) hours as needed for wheezing or shortness of breath. 06/04/23   Patt Alm Macho, MD  Albuterol -Budesonide (AIRSUPRA ) 90-80 MCG/ACT AERO Inhale 2 puffs into the lungs 4 (four) times daily as needed. 07/30/22   Darlean Ozell NOVAK, MD  amLODipine (NORVASC) 5 MG tablet Take by mouth. 06/07/22   [provider]  ARIPiprazole (ABILIFY) 2 MG tablet Take 3 mg by mouth daily.    [provider]  budesonide-formoterol (SYMBICORT) 160-4.5 MCG/ACT inhaler Inhale 2 puffs into the lungs 2 (two) times daily.    [provider]  buPROPion (WELLBUTRIN XL) 150 MG 24 hr tablet Take 150 mg by mouth daily.    [provider]  Cholecalciferol 50 MCG (2000 UT) CAPS Take 1 capsule by mouth daily. 04/16/16   [provider]  cyproheptadine (PERIACTIN) 4 MG tablet Take 4 mg by mouth 2 (two) times daily.    [provider]  DULoxetine (CYMBALTA) 60 MG  capsule  03/04/22   [provider]  esomeprazole (NEXIUM) 40 MG capsule Take by mouth. 03/01/22   [provider]  fluticasone (FLONASE) 50 MCG/ACT nasal spray Place 2 sprays into both nostrils daily.    [provider]  HYDROcodone  bit-homatropine (HYCODAN) 5-1.5 MG/5ML syrup Take 5 mLs by mouth every 6 (six) hours as needed for cough. 06/04/23   Patt Alm Macho, MD  hydrocortisone  (ANUSOL -HC) 2.5 % rectal cream Place 1 Application rectally 2 (two) times daily. 05/18/23   Carita Senior, MD  methylPREDNISolone  (MEDROL  DOSEPAK) 4 MG TBPK tablet Use as directed 06/04/23   Patt Alm Macho, MD  montelukast (SINGULAIR) 10 MG tablet Take 10 mg by mouth at bedtime.    [provider]  polyethylene glycol (MIRALAX ) 17 g packet Take 17 g by mouth daily. 05/18/23   Rancour, Senior, MD  predniSONE  (DELTASONE ) 5 MG tablet Take 1 tablet by mouth daily. 03/19/21   [provider]  TEZSPIRE 210 MG/1. syringe  08/29/21   [provider]  tiotropium (SPIRIVA) 18 MCG inhalation capsule Place 18 mcg into inhaler and inhale daily.    [provider]  Topiramate ER (TROKENDI XR) 100 MG CP24 Take 1 capsule by mouth daily.    [provider]  valACYclovir (VALTREX) 1000 MG tablet  02/13/22   [provider]  zolpidem (AMBIEN) 5 MG tablet Take 5 mg by mouth at bedtime as needed  for sleep.    [provider]    Allergies: Carbamazepine, Hydrocodone -acetaminophen , Lamictal [lamotrigine], and Fluconazole    Review of Systems  Updated Vital Signs BP 133/78   Pulse 87   Temp 98.7 F (37.1 C) (Oral)   Resp 18   Ht 5' 2 (1.575 m)   Wt 84.8 kg   LMP 07/23/2016 (Exact Date)   SpO2 98%   BMI 34.19 kg/m   Physical Exam Vitals and nursing note reviewed.  Constitutional:      General: She is not in acute distress.    Appearance: She is well-developed.  HENT:     Head: Normocephalic and atraumatic.  Eyes:      Conjunctiva/sclera: Conjunctivae normal.  Cardiovascular:     Rate and Rhythm: Normal rate and regular rhythm.     Heart sounds: Normal heart sounds. No murmur heard. Pulmonary:     Effort: Pulmonary effort is normal. No respiratory distress.     Breath sounds: Normal breath sounds.  Abdominal:     Palpations: Abdomen is soft.     Tenderness: There is abdominal tenderness in the left lower quadrant.  Genitourinary:    Comments: Hemoccult was positive, hemorrhoid present on exam but maroon stool Musculoskeletal:        General: No swelling.     Cervical back: Neck supple.  Skin:    General: Skin is warm and dry.     Capillary Refill: Capillary refill takes less than 2 seconds.  Neurological:     Mental Status: She is alert.  Psychiatric:        Mood and Affect: Mood normal.     (all labs ordered are listed, but only abnormal results are displayed) Labs Reviewed  CBC WITH DIFFERENTIAL/PLATELET - Abnormal; Notable for the following components:      Result Value   RBC 3.84 (*)    Hemoglobin 11.9 (*)    All other components within normal limits  COMPREHENSIVE METABOLIC PANEL WITH GFR - Abnormal; Notable for the following components:   CO2 21 (*)    Total Protein 6.3 (*)    All other components within normal limits  URINALYSIS, ROUTINE W REFLEX MICROSCOPIC - Abnormal; Notable for the following components:   Leukocytes,Ua TRACE (*)    All other components within normal limits  OCCULT BLOOD X 1 CARD TO LAB, STOOL - Abnormal; Notable for the following components:   Fecal Occult Bld POSITIVE (*)    All other components within normal limits  URINALYSIS, MICROSCOPIC (REFLEX) - Abnormal; Notable for the following components:   Bacteria, UA RARE (*)    All other components within normal limits  LIPASE, BLOOD    EKG: None  Radiology: CT ANGIO GI BLEED Result Date: 02/07/2024 EXAM: CTA ABDOMEN AND PELVIS WITH CONTRAST 02/07/2024 09:15:00 PM TECHNIQUE: CTA images of the abdomen  and pelvis with intravenous contrast. 75 mL (iohexol  (OMNIPAQUE ) 350 MG/ML injection 75 mL IOHEXOL  350 MG/ML SOLN) was administered. Three-dimensional MIP/volume rendered formations were performed. Automated exposure control, iterative reconstruction, and/or weight based adjustment of the mA/kV was utilized to reduce the radiation dose to as low as reasonably achievable. COMPARISON: CT 10/19/2023 CLINICAL HISTORY: GI bleed. Patient reports rectal bleeding and LLQ pain; was evaluated 2 days ago for hemorrhoids and rectal bleeding, but did not have the abdominal pain at that time; +nausea. FINDINGS: VASCULATURE: GI BLEED: No active GI bleeding. AORTA: No acute finding. No abdominal aortic aneurysm. No dissection. CELIAC TRUNK: No acute finding. No occlusion or significant  stenosis. SUPERIOR MESENTERIC ARTERY: No acute finding. No occlusion or significant stenosis. INFERIOR MESENTERIC ARTERY: No acute finding. No occlusion or significant stenosis. RENAL ARTERIES: No acute finding. No occlusion or significant stenosis. ILIAC ARTERIES: No acute finding. No occlusion or significant stenosis. ABDOMEN/PELVIS: LOWER CHEST: Visualized portion of the lower chest demonstrates no acute abnormality. LIVER: The liver is unremarkable. GALLBLADDER AND BILE DUCTS: Cholecystectomy. No biliary ductal dilatation. SPLEEN: The spleen is unremarkable. PANCREAS: The pancreas is unremarkable. ADRENAL GLANDS: Bilateral adrenal glands demonstrate no acute abnormality. KIDNEYS, URETERS AND BLADDER: No stones in the kidneys or ureters. No hydronephrosis. No perinephric or periureteral stranding. Urinary bladder is unremarkable. GI AND BOWEL: Descending and sigmoid colon diverticulosis without evidence of diverticulitis. Stomach and duodenal sweep demonstrate no acute abnormality. There is no bowel obstruction. No abnormal bowel wall thickening or distension. REPRODUCTIVE: Reproductive organs are unremarkable. PERITONEUM AND RETROPERITONEUM: No  ascites or free air. LYMPH NODES: No lymphadenopathy. BONES AND SOFT TISSUES: No acute abnormality of the bones. No acute soft tissue abnormality. IMPRESSION: 1. No active GI bleeding. 2. Descending and sigmoid colon diverticulosis without evidence of diverticulitis. Electronically signed by: Norman Gatlin MD 02/07/2024 09:27 PM EDT RP Workstation: [id number]     Procedures   Medications Ordered in the ED  iohexol  (OMNIPAQUE ) 350 MG/ML injection 75 mL (75 mLs Intravenous Contrast Given 02/07/24 2119)                                    Medical Decision Making Amount and/or Complexity of Data Reviewed Labs: ordered. Radiology: ordered.  Risk Prescription drug management. Decision regarding hospitalization.   Bascom Finder is here with rectal bleeding.  Unremarkable vitals.  No fever.  Sounds like history of colon polyps, hemorrhoids, diverticulosis.  She has had 2 episodes of bright red bloody bowel movements just prior to coming here.  She states that these were mostly blood products that were passed and did not appear to have any stool.  She had a bowel movement this morning that looked brown.  She has been noticing some blood in the toilet paper thought maybe hemorrhoids and was using suppositories and treatment for this as recommended by her primary care/GI doctor.  She just had a workup recently that was negative for multiple myeloma.  Is not on any blood thinners.  She is having some discomfort in the abdomen especially the left lower side.  She had a CT scan a while back in our system that showed diverticulosis.  Overall she is hemodynamically stable.  Is on blood thinners.  Will check basic labs.  Her Hemoccult was positive.  Stool looks grossly maroon.  Will check CT angio of the abdomen and pelvis check basic labs and talk with GI.  Differential diagnosis could be polyp bleeding hemorrhoid bleeding could be diverticular bleeding.  She is not on blood thinners.  She is hemodynamically  stable.  Hemoglobin is 11.9.  Close to her baseline.  CT angio of the abdomen pelvis shows no active bleeding.  Does show diverticulosis.  I do suspect diverticular bleeding.  I do think it is reasonable to do overnight admission to trend her hemoglobin and have her evaluated by GI in the morning.  I talked with Dr. Dianna who is aware and will follow in the morning with [facility].  Will admit to hospitalist for further care.  This chart was dictated using voice recognition software.  Despite best efforts  to proofread,  errors can occur which can change the documentation meaning.      Final diagnoses:  Hematochezia  Acute GI bleeding  Rectal bleeding    ED Discharge Orders     None          Ruthe Cornet, DO 02/07/24 2143

## 2024-02-08 DIAGNOSIS — R110 Nausea: Secondary | ICD-10-CM | POA: Diagnosis present

## 2024-02-08 DIAGNOSIS — F419 Anxiety disorder, unspecified: Secondary | ICD-10-CM | POA: Diagnosis present

## 2024-02-08 DIAGNOSIS — Z860100 Personal history of colon polyps, unspecified: Secondary | ICD-10-CM | POA: Diagnosis not present

## 2024-02-08 DIAGNOSIS — J4540 Moderate persistent asthma, uncomplicated: Secondary | ICD-10-CM | POA: Diagnosis present

## 2024-02-08 DIAGNOSIS — Z7951 Long term (current) use of inhaled steroids: Secondary | ICD-10-CM | POA: Diagnosis not present

## 2024-02-08 DIAGNOSIS — Z888 Allergy status to other drugs, medicaments and biological substances status: Secondary | ICD-10-CM | POA: Diagnosis not present

## 2024-02-08 DIAGNOSIS — K5731 Diverticulosis of large intestine without perforation or abscess with bleeding: Secondary | ICD-10-CM | POA: Diagnosis present

## 2024-02-08 DIAGNOSIS — K648 Other hemorrhoids: Secondary | ICD-10-CM | POA: Diagnosis present

## 2024-02-08 DIAGNOSIS — K922 Gastrointestinal hemorrhage, unspecified: Secondary | ICD-10-CM | POA: Diagnosis present

## 2024-02-08 DIAGNOSIS — K5791 Diverticulosis of intestine, part unspecified, without perforation or abscess with bleeding: Secondary | ICD-10-CM | POA: Diagnosis not present

## 2024-02-08 DIAGNOSIS — G43909 Migraine, unspecified, not intractable, without status migrainosus: Secondary | ICD-10-CM | POA: Diagnosis present

## 2024-02-08 DIAGNOSIS — Z79899 Other long term (current) drug therapy: Secondary | ICD-10-CM | POA: Diagnosis not present

## 2024-02-08 DIAGNOSIS — K921 Melena: Secondary | ICD-10-CM | POA: Diagnosis present

## 2024-02-08 DIAGNOSIS — M069 Rheumatoid arthritis, unspecified: Secondary | ICD-10-CM | POA: Diagnosis present

## 2024-02-08 DIAGNOSIS — Z803 Family history of malignant neoplasm of breast: Secondary | ICD-10-CM | POA: Diagnosis not present

## 2024-02-08 DIAGNOSIS — Z825 Family history of asthma and other chronic lower respiratory diseases: Secondary | ICD-10-CM | POA: Diagnosis not present

## 2024-02-08 DIAGNOSIS — Z885 Allergy status to narcotic agent status: Secondary | ICD-10-CM | POA: Diagnosis not present

## 2024-02-08 DIAGNOSIS — K5793 Diverticulitis of intestine, part unspecified, without perforation or abscess with bleeding: Secondary | ICD-10-CM

## 2024-02-08 DIAGNOSIS — I10 Essential (primary) hypertension: Secondary | ICD-10-CM | POA: Diagnosis present

## 2024-02-08 DIAGNOSIS — G40909 Epilepsy, unspecified, not intractable, without status epilepticus: Secondary | ICD-10-CM | POA: Diagnosis present

## 2024-02-08 DIAGNOSIS — Z7952 Long term (current) use of systemic steroids: Secondary | ICD-10-CM | POA: Diagnosis not present

## 2024-02-08 DIAGNOSIS — Z8261 Family history of arthritis: Secondary | ICD-10-CM | POA: Diagnosis not present

## 2024-02-08 LAB — CBC
HCT: 42.2 % (ref 36.0–46.0)
Hemoglobin: 12.9 g/dL (ref 12.0–15.0)
MCH: 30.4 pg (ref 26.0–34.0)
MCHC: 30.6 g/dL (ref 30.0–36.0)
MCV: 99.5 fL (ref 80.0–100.0)
Platelets: 192 K/uL (ref 150–400)
RBC: 4.24 MIL/uL (ref 3.87–5.11)
RDW: 13.4 % (ref 11.5–15.5)
WBC: 6.2 K/uL (ref 4.0–10.5)
nRBC: 0 % (ref 0.0–0.2)

## 2024-02-08 LAB — HIV ANTIBODY (ROUTINE TESTING W REFLEX): HIV Screen 4th Generation wRfx: NONREACTIVE

## 2024-02-08 LAB — HEMOGLOBIN AND HEMATOCRIT, BLOOD
HCT: 33.8 % — ABNORMAL LOW (ref 36.0–46.0)
Hemoglobin: 10.9 g/dL — ABNORMAL LOW (ref 12.0–15.0)

## 2024-02-08 MED ORDER — MONTELUKAST SODIUM 10 MG PO TABS
10.0000 mg | ORAL_TABLET | Freq: Every day | ORAL | Status: DC
  Administered 2024-02-08 – 2024-02-09 (×2): 10 mg via ORAL
  Filled 2024-02-08 (×2): qty 1

## 2024-02-08 MED ORDER — TOPIRAMATE ER 200 MG PO CAP24
200.0000 mg | ORAL_CAPSULE | Freq: Every day | ORAL | Status: DC
  Administered 2024-02-08 – 2024-02-09 (×2): 200 mg via ORAL
  Filled 2024-02-08 (×2): qty 1

## 2024-02-08 MED ORDER — METOCLOPRAMIDE HCL 5 MG/ML IJ SOLN
10.0000 mg | Freq: Once | INTRAMUSCULAR | Status: AC
  Administered 2024-02-08: 10 mg via INTRAVENOUS
  Filled 2024-02-08: qty 2

## 2024-02-08 MED ORDER — SODIUM CHLORIDE 0.9 % IV SOLN
12.5000 mg | Freq: Four times a day (QID) | INTRAVENOUS | Status: DC | PRN
  Administered 2024-02-08: 12.5 mg via INTRAVENOUS
  Filled 2024-02-08: qty 12.5
  Filled 2024-02-08: qty 0.5

## 2024-02-08 MED ORDER — MORPHINE SULFATE (PF) 4 MG/ML IV SOLN
4.0000 mg | Freq: Once | INTRAVENOUS | Status: AC
  Administered 2024-02-08: 4 mg via INTRAVENOUS
  Filled 2024-02-08: qty 1

## 2024-02-08 MED ORDER — ACETAMINOPHEN 325 MG PO TABS
650.0000 mg | ORAL_TABLET | Freq: Four times a day (QID) | ORAL | Status: DC | PRN
  Administered 2024-02-08 – 2024-02-09 (×3): 650 mg via ORAL
  Filled 2024-02-08 (×3): qty 2

## 2024-02-08 MED ORDER — ONDANSETRON HCL 4 MG/2ML IJ SOLN
4.0000 mg | Freq: Once | INTRAMUSCULAR | Status: AC
Start: 2024-02-08 — End: 2024-02-08
  Administered 2024-02-08: 4 mg via INTRAVENOUS
  Filled 2024-02-08: qty 2

## 2024-02-08 MED ORDER — FLUTICASONE FUROATE-VILANTEROL 200-25 MCG/ACT IN AEPB
1.0000 | INHALATION_SPRAY | Freq: Every day | RESPIRATORY_TRACT | Status: DC
  Administered 2024-02-08 – 2024-02-10 (×3): 1 via RESPIRATORY_TRACT
  Filled 2024-02-08: qty 28

## 2024-02-08 MED ORDER — ONDANSETRON HCL 4 MG/2ML IJ SOLN
4.0000 mg | Freq: Four times a day (QID) | INTRAMUSCULAR | Status: DC | PRN
  Administered 2024-02-08 – 2024-02-09 (×3): 4 mg via INTRAVENOUS
  Filled 2024-02-08 (×3): qty 2

## 2024-02-08 MED ORDER — ZOLPIDEM TARTRATE 5 MG PO TABS
5.0000 mg | ORAL_TABLET | Freq: Every evening | ORAL | Status: DC | PRN
  Administered 2024-02-09: 5 mg via ORAL
  Filled 2024-02-08: qty 1

## 2024-02-08 MED ORDER — ACETAMINOPHEN 650 MG RE SUPP: 650.0000 mg | Freq: Four times a day (QID) | RECTAL | Status: DC | PRN

## 2024-02-08 MED ORDER — LEVETIRACETAM 500 MG PO TABS
500.0000 mg | ORAL_TABLET | Freq: Two times a day (BID) | ORAL | Status: DC
  Administered 2024-02-08 – 2024-02-10 (×4): 500 mg via ORAL
  Filled 2024-02-08 (×4): qty 1

## 2024-02-08 MED ORDER — DIPHENHYDRAMINE HCL 50 MG/ML IJ SOLN
12.5000 mg | Freq: Once | INTRAMUSCULAR | Status: AC
  Administered 2024-02-08: 12.5 mg via INTRAVENOUS
  Filled 2024-02-08: qty 1

## 2024-02-08 MED ORDER — TOPIRAMATE ER 100 MG PO CAP24: 1.0000 | ORAL_CAPSULE | Freq: Every day | ORAL | Status: DC

## 2024-02-08 MED ORDER — ALBUTEROL SULFATE (2.5 MG/3ML) 0.083% IN NEBU: 5.0000 mg | INHALATION_SOLUTION | Freq: Four times a day (QID) | RESPIRATORY_TRACT | Status: DC | PRN

## 2024-02-08 MED ORDER — CYPROHEPTADINE HCL 4 MG PO TABS
4.0000 mg | ORAL_TABLET | Freq: Two times a day (BID) | ORAL | Status: DC
Start: 2024-02-09 — End: 2024-02-10
  Administered 2024-02-09 – 2024-02-10 (×3): 4 mg via ORAL
  Filled 2024-02-08 (×3): qty 1

## 2024-02-08 MED ORDER — TOPIRAMATE ER 100 MG PO CAP24
1.0000 | ORAL_CAPSULE | Freq: Every day | ORAL | Status: DC
Start: 2024-02-08 — End: 2024-02-08

## 2024-02-08 MED ORDER — ARIPIPRAZOLE 10 MG PO TABS
10.0000 mg | ORAL_TABLET | Freq: Two times a day (BID) | ORAL | Status: DC
  Administered 2024-02-08 – 2024-02-10 (×4): 10 mg via ORAL
  Filled 2024-02-08 (×4): qty 1

## 2024-02-08 MED ORDER — BUPROPION HCL ER (XL) 150 MG PO TB24
150.0000 mg | ORAL_TABLET | Freq: Every day | ORAL | Status: DC
  Administered 2024-02-09 – 2024-02-10 (×2): 150 mg via ORAL
  Filled 2024-02-08 (×2): qty 1

## 2024-02-08 MED ORDER — CLONAZEPAM 0.5 MG PO TABS: 0.5000 mg | ORAL_TABLET | Freq: Every day | ORAL | Status: DC | PRN

## 2024-02-08 MED ORDER — HYDROCORTISONE (PERIANAL) 2.5 % EX CREA
1.0000 | TOPICAL_CREAM | Freq: Two times a day (BID) | CUTANEOUS | Status: DC
  Administered 2024-02-08 – 2024-02-10 (×3): 1 via RECTAL
  Filled 2024-02-08: qty 28.35

## 2024-02-08 MED ORDER — DULOXETINE HCL 60 MG PO CPEP
60.0000 mg | ORAL_CAPSULE | Freq: Two times a day (BID) | ORAL | Status: DC
Start: 2024-02-08 — End: 2024-02-10
  Administered 2024-02-08 – 2024-02-10 (×4): 60 mg via ORAL
  Filled 2024-02-08 (×4): qty 1

## 2024-02-08 MED ORDER — ONDANSETRON HCL 4 MG PO TABS
4.0000 mg | ORAL_TABLET | Freq: Four times a day (QID) | ORAL | Status: DC | PRN
  Administered 2024-02-10: 4 mg via ORAL
  Filled 2024-02-08: qty 1

## 2024-02-08 MED ORDER — ONDANSETRON HCL 4 MG/2ML IJ SOLN
4.0000 mg | Freq: Once | INTRAMUSCULAR | Status: AC
  Administered 2024-02-08: 4 mg via INTRAVENOUS
  Filled 2024-02-08: qty 2

## 2024-02-08 MED ORDER — SODIUM CHLORIDE 0.9 % IV SOLN: INTRAVENOUS | Status: AC

## 2024-02-08 NOTE — ED Notes (Signed)
 Report given to carelink and receiving nurse at [facility] .

## 2024-02-08 NOTE — Consult Note (Signed)
 Referring Provider: Dr. Ruthe Primary Care Physician:  Lelon Norleen ORN., MD Primary Gastroenterologist:  [place] (Dr. Ladora in [facility])  Reason for Consultation:  Rectal bleeding  HPI: Alexis Hall is a 53 y.o. female with acute onset of LLQ pain with large episode of maroon-colored stool X 2. Felt dizzy at home. Denies black stools. Small amount of blood with wiping in [facility]. History of colon polyps and diverticulosis and last colonoscopy in 2024 (Dr. Ladora) and reportedly had colon polyps removed and was advised to repeat in 3 years. Denies NSAIDs or anticoagulation. Denies alcohol. Hgb 10.9. CT angiogram negative for active GI bleeding.  Past Medical History:  Diagnosis Date   Asthma    Hypertension    MGUS (monoclonal gammopathy of unknown significance)    Migraines    Rheumatoid arthritis (HCC)     Past Surgical History:  Procedure Laterality Date   BREAST BIOPSY Right 2007   BREAST REDUCTION SURGERY  2005   CHOLECYSTECTOMY  05-13-04   REDUCTION MAMMAPLASTY Bilateral 2004    Prior to Admission medications   Medication Sig Start Date End Date Taking? Authorizing Provider  albuterol  (PROVENTIL ) (5 MG/ML) 0.5% nebulizer solution Take 1 mL (5 mg total) by nebulization every 6 (six) hours as needed for wheezing or shortness of breath. 06/04/23   Patt Alm Macho, MD  Albuterol -Budesonide (AIRSUPRA ) 90-80 MCG/ACT AERO Inhale 2 puffs into the lungs 4 (four) times daily as needed. 07/30/22   Darlean Ozell NOVAK, MD  amLODipine (NORVASC) 5 MG tablet Take by mouth. 06/07/22   [provider]  ARIPiprazole (ABILIFY) 2 MG tablet Take 3 mg by mouth daily.    [provider]  budesonide-formoterol (SYMBICORT) 160-4.5 MCG/ACT inhaler Inhale 2 puffs into the lungs 2 (two) times daily.    [provider]  buPROPion (WELLBUTRIN XL) 150 MG 24 hr tablet Take 150 mg by mouth daily.    [provider]  Cholecalciferol 50 MCG (2000 UT) CAPS Take 1 capsule by mouth daily.  04/16/16   [provider]  cyproheptadine (PERIACTIN) 4 MG tablet Take 4 mg by mouth 2 (two) times daily.    [provider]  DULoxetine (CYMBALTA) 60 MG capsule  03/04/22   [provider]  esomeprazole (NEXIUM) 40 MG capsule Take by mouth. 03/01/22   [provider]  fluticasone (FLONASE) 50 MCG/ACT nasal spray Place 2 sprays into both nostrils daily.    [provider]  HYDROcodone  bit-homatropine (HYCODAN) 5-1.5 MG/5ML syrup Take 5 mLs by mouth every 6 (six) hours as needed for cough. 06/04/23   Patt Alm Macho, MD  hydrocortisone  (ANUSOL -HC) 2.5 % rectal cream Place 1 Application rectally 2 (two) times daily. 05/18/23   Rancour, Garnette, MD  methylPREDNISolone  (MEDROL  DOSEPAK) 4 MG TBPK tablet Use as directed 06/04/23   Patt Alm Macho, MD  montelukast (SINGULAIR) 10 MG tablet Take 10 mg by mouth at bedtime.    [provider]  polyethylene glycol (MIRALAX ) 17 g packet Take 17 g by mouth daily. 05/18/23   Rancour, Garnette, MD  predniSONE  (DELTASONE ) 5 MG tablet Take 1 tablet by mouth daily. 03/19/21   [provider]  TEZSPIRE 210 MG/1. syringe  08/29/21   [provider]  tiotropium (SPIRIVA) 18 MCG inhalation capsule Place 18 mcg into inhaler and inhale daily.    [provider]  Topiramate ER (TROKENDI XR) 100 MG CP24 Take 1 capsule by mouth daily.    [provider]  valACYclovir (VALTREX) 1000 MG  tablet  02/13/22   [provider]  zolpidem (AMBIEN) 5 MG tablet Take 5 mg by mouth at bedtime as needed for sleep.    [provider]    Scheduled Meds:  ARIPiprazole  10 mg Oral BID   [START ON 02/09/2024] buPROPion  150 mg Oral Daily   [START ON 02/09/2024] cyproheptadine  4 mg Oral BID   DULoxetine  60 mg Oral BID   fluticasone furoate-vilanterol  1 puff Inhalation Daily   hydrocortisone   1 Application Rectal BID   montelukast  10 mg Oral QHS   [START ON 02/09/2024] Topiramate ER   1 capsule Oral Daily   Continuous Infusions:  sodium chloride      PRN Meds:.acetaminophen  **OR** acetaminophen , albuterol , ondansetron **OR** ondansetron (ZOFRAN) IV, zolpidem  Allergies as of 02/07/2024 - Review Complete 02/07/2024  Allergen Reaction Noted   Carbamazepine  05/18/2023   Hydrocodone -acetaminophen      Lamictal [lamotrigine]  10/28/2022   Fluconazole Rash 07/02/2013    Family History  Problem Relation Age of Onset   Allergies Mother    Asthma Mother    Rheum arthritis Mother    Breast cancer Maternal Aunt     Social History   Socioeconomic History   Marital status: Married    Spouse name: Not on file   Number of children: Not on file   Years of education: Not on file   Highest education level: Not on file  Occupational History   Occupation: NP    Employer: [employer]  Tobacco Use   Smoking status: Never   Smokeless tobacco: Never  Vaping Use   Vaping status: Never Used  Substance and Sexual Activity   Alcohol use: No   Drug use: No   Sexual activity: Not on file  Other Topics Concern   Not on file  Social History Narrative   Not on file   Social Drivers of Health   Financial Resource Strain: Low Risk  (11/23/2021)   Received from [facility] visits prior to 07/13/2022., [facility]   Overall Financial Resource Strain (CARDIA)    Difficulty of Paying Living Expenses: Not very hard  Food Insecurity: No Food Insecurity (02/08/2024)   Hunger Vital Sign    Worried About Running Out of Food in the Last Year: Never true    Ran Out of Food in the Last Year: Never true  Transportation Needs: No Transportation Needs (02/08/2024)   PRAPARE - Transportation    Lack of Transportation (Medical): No    Lack of Transportation (Non-Medical): No  Physical Activity: Inactive (11/23/2021)   Received from [facility] visits prior to 07/13/2022., [facility]   Exercise Vital Sign    On average, how many days per  week do you engage in moderate to strenuous exercise (like a brisk walk)?: 0 days    On average, how many minutes do you engage in exercise at this level?: 0 min  Stress: No Stress Concern Present (11/23/2021)   Received from [facility] visits prior to 07/13/2022., [facility]   [employer] of Occupational Health - Occupational Stress Questionnaire    Feeling of Stress : Only a little  Social Connections: Moderately Isolated (02/08/2024)   Social Connection and Isolation Panel    Frequency of Communication with Friends and Family: Three times a week    Frequency of Social Gatherings with Friends and Family: Twice a week    Attends Religious Services: Never  Active Member of Clubs or Organizations: No    Attends Banker Meetings: Never    Marital Status: Married  Catering manager Violence: Not At Risk (02/08/2024)   Humiliation, Afraid, Rape, and Kick questionnaire    Fear of Current or Ex-Partner: No    Emotionally Abused: No    Physically Abused: No    Sexually Abused: No    Review of Systems: All negative except as stated above in HPI.  Physical Exam: Vital signs: Vitals:   02/08/24 1200 02/08/24 1350  BP: 135/77 (!) 153/80  Pulse: 86 74  Resp: 16 19  Temp:  98.2 F (36.8 C)  SpO2: 98% 95%   Last BM Date : 02/07/24 General:   Lethargic, Well-developed, well-nourished, pleasant and cooperative in NAD Head: normocephalic, atraumatic Eyes: anicteric sclera ENT: oropharynx clear Neck: supple, nontender Lungs:  Clear throughout to auscultation.   No wheezes, crackles, or rhonchi. No acute distress. Heart:  Regular rate and rhythm; no murmurs, clicks, rubs,  or gallops. Abdomen: LLQ tenderness with minimal guarding, soft, nondistended, +BS  Rectal:  Deferred Ext: no edema  GI:  Lab Results: Recent Labs    02/07/24 2007 02/08/24 0209  WBC 5.4  --   HGB 11.9* 10.9*  HCT 36.9 33.8*  PLT 186  --    BMET Recent Labs     02/07/24 2007  NA 140  K 4.1  CL 110  CO2 21*  GLUCOSE 92  BUN 11  CREATININE 0.82  CALCIUM 9.1   LFT Recent Labs    02/07/24 2007  PROT 6.3*  ALBUMIN 4.2  AST 21  ALT 29  ALKPHOS 66  BILITOT 0.2   PT/INR No results for input(s): LABPROT, INR in the last 72 hours.    Impression/Plan: Hematochezia with LLQ pain but suspect diverticular source. Seems to be resolving. I do not think a repeat colonoscopy is needed unless bleeding increases and persists since she last had one in 2024. Clear liquid diet. Supportive care. Dr. Rollin will f/u tomorrow for GI.    LOS: 0 days   Alexis Hall  02/08/2024, 2:51 PM  Questions please call [phone]

## 2024-02-08 NOTE — H&P (Signed)
 History and Physical    Alexis Hall FMW:[id number] DOB: 08/09/1970 DOA: 02/07/2024  PCP: Lelon Norleen ORN., MD  Patient coming from: Home via emergency room  I have personally briefly reviewed patient's old medical records available.   Chief Complaint: Rectal bleeding for 3 days  HPI: Alexis Hall is a 53 y.o. female with medical history significant of diverticulosis, internal hemorrhoids, asthma, migraine headache who is started having some minor amount of fresh rectal bleeding 3 days ago, since last 2 days she had left lower quadrant crampy abdominal pain, sometimes severe with some nausea, she had mostly blood mixed with stool.  Last 1 after the bowel movement was only blood that was maroon in color and large in quantity.  She has not have any bowel movement or bleeding per rectum since coming to the emergency room last 16 hours.  Patient had similar self-limiting bleeding last year, followed by a colonoscopy that showed some polyps and left-sided diverticulosis.  She also was found to have internal hemorrhoids. ED Course: Hemodynamically stable.  Hemoglobin at about baseline.  She was given pain medications.  IV fluids.  Or advised monitoring. On my interview after she arrived to the hospital ward, patient has mild discomfort left lower quadrant but no other complaints.  Passing flatus.  Review of Systems: all systems are reviewed and pertinent positive as per HPI otherwise rest are negative.    Past Medical History:  Diagnosis Date   Asthma    Hypertension    MGUS (monoclonal gammopathy of unknown significance)    Migraines    Rheumatoid arthritis (HCC)     Past Surgical History:  Procedure Laterality Date   BREAST BIOPSY Right 2007   BREAST REDUCTION SURGERY  2005   CHOLECYSTECTOMY  05-13-04   REDUCTION MAMMAPLASTY Bilateral 2004    Social history   reports that she has never smoked. She has never used smokeless tobacco. She reports that she does not drink alcohol and does not  use drugs.  Allergies  Allergen Reactions   Carbamazepine    Hydrocodone -Acetaminophen      REACTION: gi upset   Lamictal [Lamotrigine]    Fluconazole Rash    Family History  Problem Relation Age of Onset   Allergies Mother    Asthma Mother    Rheum arthritis Mother    Breast cancer Maternal Aunt      Prior to Admission medications   Medication Sig Start Date End Date Taking? Authorizing Provider  albuterol  (PROVENTIL ) (5 MG/ML) 0.5% nebulizer solution Take 1 mL (5 mg total) by nebulization every 6 (six) hours as needed for wheezing or shortness of breath. 06/04/23   Patt Alm Macho, MD  Albuterol -Budesonide (AIRSUPRA ) 90-80 MCG/ACT AERO Inhale 2 puffs into the lungs 4 (four) times daily as needed. 07/30/22   Darlean Ozell NOVAK, MD  amLODipine (NORVASC) 5 MG tablet Take by mouth. 06/07/22   [provider]  ARIPiprazole (ABILIFY) 2 MG tablet Take 3 mg by mouth daily.    [provider]  budesonide-formoterol (SYMBICORT) 160-4.5 MCG/ACT inhaler Inhale 2 puffs into the lungs 2 (two) times daily.    [provider]  buPROPion (WELLBUTRIN XL) 150 MG 24 hr tablet Take 150 mg by mouth daily.    [provider]  Cholecalciferol 50 MCG (2000 UT) CAPS Take 1 capsule by mouth daily. 04/16/16   [provider]  cyproheptadine (PERIACTIN) 4 MG tablet Take 4 mg by mouth 2 (two) times daily.    [provider]  DULoxetine (CYMBALTA)  60 MG capsule  03/04/22   [provider]  esomeprazole (NEXIUM) 40 MG capsule Take by mouth. 03/01/22   [provider]  fluticasone (FLONASE) 50 MCG/ACT nasal spray Place 2 sprays into both nostrils daily.    [provider]  HYDROcodone  bit-homatropine (HYCODAN) 5-1.5 MG/5ML syrup Take 5 mLs by mouth every 6 (six) hours as needed for cough. 06/04/23   Patt Alm Macho, MD  hydrocortisone  (ANUSOL -HC) 2.5 % rectal cream Place 1 Application rectally 2 (two) times daily. 05/18/23   Rancour,  Garnette, MD  methylPREDNISolone  (MEDROL  DOSEPAK) 4 MG TBPK tablet Use as directed 06/04/23   Patt Alm Macho, MD  montelukast (SINGULAIR) 10 MG tablet Take 10 mg by mouth at bedtime.    [provider]  polyethylene glycol (MIRALAX ) 17 g packet Take 17 g by mouth daily. 05/18/23   Rancour, Garnette, MD  predniSONE  (DELTASONE ) 5 MG tablet Take 1 tablet by mouth daily. 03/19/21   [provider]  TEZSPIRE 210 MG/1. syringe  08/29/21   [provider]  tiotropium (SPIRIVA) 18 MCG inhalation capsule Place 18 mcg into inhaler and inhale daily.    [provider]  Topiramate ER (TROKENDI XR) 100 MG CP24 Take 1 capsule by mouth daily.    [provider]  valACYclovir (VALTREX) 1000 MG tablet  02/13/22   [provider]  zolpidem (AMBIEN) 5 MG tablet Take 5 mg by mouth at bedtime as needed for sleep.    [provider]    Physical Exam: Vitals:   02/08/24 0900 02/08/24 1126 02/08/24 1200 02/08/24 1350  BP: 126/71  135/77 (!) 153/80  Pulse: 71  86 74  Resp: 17  16 19   Temp:  98.3 F (36.8 C)  98.2 F (36.8 C)  TempSrc:  Oral  Oral  SpO2: 91%  98% 95%  Weight:      Height:        Constitutional: NAD, calm, comfortable Vitals:   02/08/24 0900 02/08/24 1126 02/08/24 1200 02/08/24 1350  BP: 126/71  135/77 (!) 153/80  Pulse: 71  86 74  Resp: 17  16 19   Temp:  98.3 F (36.8 C)  98.2 F (36.8 C)  TempSrc:  Oral  Oral  SpO2: 91%  98% 95%  Weight:      Height:       Eyes: PERRL, lids and conjunctivae normal ENMT: Mucous membranes are moist. Posterior pharynx clear of any exudate or lesions.Normal dentition.  Neck: normal, supple, no masses, no thyromegaly Respiratory: clear to auscultation bilaterally, no wheezing, no crackles. Normal respiratory effort. No accessory muscle use.  Cardiovascular: Regular rate and rhythm, no murmurs / rubs / gallops. No extremity edema. 2+ pedal pulses. No carotid bruits.  Abdomen: no  tenderness, no masses palpated. No hepatosplenomegaly. Bowel sounds positive.  Musculoskeletal: no clubbing / cyanosis. No joint deformity upper and lower extremities. Good ROM, no contractures. Normal muscle tone.  Skin: no rashes, lesions, ulcers. No induration Neurologic: CN 2-12 grossly intact. Sensation intact, DTR normal. Strength 5/5 in all 4.  Psychiatric: Normal judgment and insight. Alert and oriented x 3. Normal mood.     Labs on Admission: I have personally reviewed following labs and imaging studies  CBC: Recent Labs  Lab 02/07/24 2007 02/08/24 0209  WBC 5.4  --   NEUTROABS 2.8  --   HGB 11.9* 10.9*  HCT 36.9 33.8*  MCV 96.1  --   PLT 186  --    Basic Metabolic Panel: Recent  Labs  Lab 02/07/24 2007  NA 140  K 4.1  CL 110  CO2 21*  GLUCOSE 92  BUN 11  CREATININE 0.82  CALCIUM 9.1   GFR: Estimated Creatinine Clearance: 80.2 mL/min (by C-G formula based on SCr of 0.82 mg/dL). Liver Function Tests: Recent Labs  Lab 02/07/24 2007  AST 21  ALT 29  ALKPHOS 66  BILITOT 0.2  PROT 6.3*  ALBUMIN 4.2   Recent Labs  Lab 02/07/24 2007  LIPASE 28   No results for input(s): AMMONIA in the last 168 hours. Coagulation Profile: No results for input(s): INR, PROTIME in the last 168 hours. Cardiac Enzymes: No results for input(s): CKTOTAL, CKMB, CKMBINDEX, TROPONINI in the last 168 hours. BNP (last 3 results) No results for input(s): PROBNP in the last 8760 hours. HbA1C: No results for input(s): HGBA1C in the last 72 hours. CBG: No results for input(s): GLUCAP in the last 168 hours. Lipid Profile: No results for input(s): CHOL, HDL, LDLCALC, TRIG, CHOLHDL, LDLDIRECT in the last 72 hours. Thyroid Function Tests: No results for input(s): TSH, T4TOTAL, FREET4, T3FREE, THYROIDAB in the last 72 hours. Anemia Panel: No results for input(s): VITAMINB12, FOLATE, FERRITIN, TIBC, IRON, RETICCTPCT in the last 72  hours. Urine analysis:    Component Value Date/Time   COLORURINE YELLOW 02/07/2024 2021   APPEARANCEUR CLEAR 02/07/2024 2021   LABSPEC 1.015 02/07/2024 2021   PHURINE 6.5 02/07/2024 2021   GLUCOSEU NEGATIVE 02/07/2024 2021   HGBUR NEGATIVE 02/07/2024 2021   BILIRUBINUR NEGATIVE 02/07/2024 2021   KETONESUR NEGATIVE 02/07/2024 2021   PROTEINUR NEGATIVE 02/07/2024 2021   NITRITE NEGATIVE 02/07/2024 2021   LEUKOCYTESUR TRACE (A) 02/07/2024 2021    Radiological Exams on Admission: CT ANGIO GI BLEED Result Date: 02/07/2024 EXAM: CTA ABDOMEN AND PELVIS WITH CONTRAST 02/07/2024 09:15:00 PM TECHNIQUE: CTA images of the abdomen and pelvis with intravenous contrast. 75 mL (iohexol  (OMNIPAQUE ) 350 MG/ML injection 75 mL IOHEXOL  350 MG/ML SOLN) was administered. Three-dimensional MIP/volume rendered formations were performed. Automated exposure control, iterative reconstruction, and/or weight based adjustment of the mA/kV was utilized to reduce the radiation dose to as low as reasonably achievable. COMPARISON: CT 10/19/2023 CLINICAL HISTORY: GI bleed. Patient reports rectal bleeding and LLQ pain; was evaluated 2 days ago for hemorrhoids and rectal bleeding, but did not have the abdominal pain at that time; +nausea. FINDINGS: VASCULATURE: GI BLEED: No active GI bleeding. AORTA: No acute finding. No abdominal aortic aneurysm. No dissection. CELIAC TRUNK: No acute finding. No occlusion or significant stenosis. SUPERIOR MESENTERIC ARTERY: No acute finding. No occlusion or significant stenosis. INFERIOR MESENTERIC ARTERY: No acute finding. No occlusion or significant stenosis. RENAL ARTERIES: No acute finding. No occlusion or significant stenosis. ILIAC ARTERIES: No acute finding. No occlusion or significant stenosis. ABDOMEN/PELVIS: LOWER CHEST: Visualized portion of the lower chest demonstrates no acute abnormality. LIVER: The liver is unremarkable. GALLBLADDER AND BILE DUCTS: Cholecystectomy. No biliary ductal  dilatation. SPLEEN: The spleen is unremarkable. PANCREAS: The pancreas is unremarkable. ADRENAL GLANDS: Bilateral adrenal glands demonstrate no acute abnormality. KIDNEYS, URETERS AND BLADDER: No stones in the kidneys or ureters. No hydronephrosis. No perinephric or periureteral stranding. Urinary bladder is unremarkable. GI AND BOWEL: Descending and sigmoid colon diverticulosis without evidence of diverticulitis. Stomach and duodenal sweep demonstrate no acute abnormality. There is no bowel obstruction. No abnormal bowel wall thickening or distension. REPRODUCTIVE: Reproductive organs are unremarkable. PERITONEUM AND RETROPERITONEUM: No ascites or free air. LYMPH NODES: No lymphadenopathy. BONES AND SOFT TISSUES: No acute abnormality of the  bones. No acute soft tissue abnormality. IMPRESSION: 1. No active GI bleeding. 2. Descending and sigmoid colon diverticulosis without evidence of diverticulitis. Electronically signed by: Norman Gatlin MD 02/07/2024 09:27 PM EDT RP Workstation: [id number]    Assessment/Plan Principal Problem:   GI bleed     Lower GI bleeding: Suspect diverticular bleed. CT angio negative for active bleeding.  Hemoglobin dropped by 1 point from her baseline. Clear liquid diet, monitoring serial hemoglobin.  Maintenance IV fluids.  Close monitoring.  If continues to bleed, will need colonoscopy.  Since she had colonoscopy last year, if this improves spontaneously we will not pursue any colonoscopy.  Will consult GI.  2.  Moderate persistent asthma: Currently stable.  Resume bronchodilator therapy and steroid inhalers.  3.  Migraine headaches: On maintenance treatment.  Continue.  DVT prophylaxis: SCDs Code Status: Full code Family Communication: None at the bedside Disposition Plan: Anticipate home Consults called: Gastroenterology Admission status: Inpatient.  Due to active GI bleeding, will need monitoring and treatment in the hospital.   Yenifer Saccente MD [facility]

## 2024-02-09 DIAGNOSIS — K5791 Diverticulosis of intestine, part unspecified, without perforation or abscess with bleeding: Secondary | ICD-10-CM

## 2024-02-09 LAB — CBC WITH DIFFERENTIAL/PLATELET
Abs Immature Granulocytes: 0.01 K/uL (ref 0.00–0.07)
Basophils Absolute: 0 K/uL (ref 0.0–0.1)
Basophils Relative: 0 %
Eosinophils Absolute: 0 K/uL (ref 0.0–0.5)
Eosinophils Relative: 0 %
HCT: 39 % (ref 36.0–46.0)
Hemoglobin: 12.4 g/dL (ref 12.0–15.0)
Immature Granulocytes: 0 %
Lymphocytes Relative: 25 %
Lymphs Abs: 1.2 K/uL (ref 0.7–4.0)
MCH: 32 pg (ref 26.0–34.0)
MCHC: 31.8 g/dL (ref 30.0–36.0)
MCV: 100.5 fL — ABNORMAL HIGH (ref 80.0–100.0)
Monocytes Absolute: 0.5 K/uL (ref 0.1–1.0)
Monocytes Relative: 10 %
Neutro Abs: 3.2 K/uL (ref 1.7–7.7)
Neutrophils Relative %: 65 %
Platelets: 167 K/uL (ref 150–400)
RBC: 3.88 MIL/uL (ref 3.87–5.11)
RDW: 13.4 % (ref 11.5–15.5)
WBC: 4.9 K/uL (ref 4.0–10.5)
nRBC: 0 % (ref 0.0–0.2)

## 2024-02-09 MED ORDER — DOCUSATE SODIUM 100 MG PO CAPS
100.0000 mg | ORAL_CAPSULE | Freq: Two times a day (BID) | ORAL | Status: DC
  Administered 2024-02-09 – 2024-02-10 (×3): 100 mg via ORAL
  Filled 2024-02-09 (×3): qty 1

## 2024-02-09 NOTE — Progress Notes (Signed)
 UNASSIGNED PATIENT Subjective: Patient seems to be doing well today. She has not had a BM today but has noted some BRB on the toilet tissue. She has had some LLQ pain that she rates from 5-7/10. She reportedly had a colonoscopy done last year when she was noted to have diverticulosis and polyps were removed.    Objective: Vital signs in last 24 hours: Temp:  [98.3 F (36.8 C)-99.6 F (37.6 C)] 98.7 F (37.1 C) (09/29 1339) Pulse Rate:  [71-79] 79 (09/29 1339) Resp:  [20-22] 20 (09/29 1339) BP: (133-152)/(74-84) 146/84 (09/29 1339) SpO2:  [96 %-100 %] 99 % (09/29 1339) Last BM Date : 02/07/24  Intake/Output from previous day: 09/28 0701 - 09/29 0700 In: 704.5 [P.O.:340; I.V.:364.5] Out: 500 [Emesis/NG output:500] Intake/Output this shift: Total I/O In: 240 [P.O.:240] Out: -   General appearance: alert, cooperative, appears stated age, fatigued, and no distress Resp: clear to auscultation bilaterally Cardio: regular rate and rhythm, S1, S2 normal, no murmur, click, rub or gallop GI: soft, non-tender; bowel sounds normal; no masses,  no organomegaly  Lab Results: Recent Labs    02/07/24 2007 02/08/24 0209 02/08/24 1509 02/09/24 0354  WBC 5.4  --  6.2 4.9  HGB 11.9* 10.9* 12.9 12.4  HCT 36.9 33.8* 42.2 39.0  PLT 186  --  192 167   BMET Recent Labs    02/07/24 2007  NA 140  K 4.1  CL 110  CO2 21*  GLUCOSE 92  BUN 11  CREATININE 0.82  CALCIUM 9.1   LFT Recent Labs    02/07/24 2007  PROT 6.3*  ALBUMIN 4.2  AST 21  ALT 29  ALKPHOS 66  BILITOT 0.2   PT/INR No results for input(s): LABPROT, INR in the last 72 hours. Hepatitis Panel No results for input(s): HEPBSAG, HCVAB, HEPAIGM, HEPBIGM in the last 72 hours. C-Diff No results for input(s): CDIFFTOX in the last 72 hours. No results for input(s): CDIFFPCR in the last 72 hours. Fecal Lactopherrin No results for input(s): FECLLACTOFRN in the last 72 hours.  Studies/Results: CT ANGIO  GI BLEED Result Date: 02/07/2024 EXAM: CTA ABDOMEN AND PELVIS WITH CONTRAST 02/07/2024 09:15:00 PM TECHNIQUE: CTA images of the abdomen and pelvis with intravenous contrast. 75 mL (iohexol  (OMNIPAQUE ) 350 MG/ML injection 75 mL IOHEXOL  350 MG/ML SOLN) was administered. Three-dimensional MIP/volume rendered formations were performed. Automated exposure control, iterative reconstruction, and/or weight based adjustment of the mA/kV was utilized to reduce the radiation dose to as low as reasonably achievable. COMPARISON: CT 10/19/2023 CLINICAL HISTORY: GI bleed. Patient reports rectal bleeding and LLQ pain; was evaluated 2 days ago for hemorrhoids and rectal bleeding, but did not have the abdominal pain at that time; +nausea. FINDINGS: VASCULATURE: GI BLEED: No active GI bleeding. AORTA: No acute finding. No abdominal aortic aneurysm. No dissection. CELIAC TRUNK: No acute finding. No occlusion or significant stenosis. SUPERIOR MESENTERIC ARTERY: No acute finding. No occlusion or significant stenosis. INFERIOR MESENTERIC ARTERY: No acute finding. No occlusion or significant stenosis. RENAL ARTERIES: No acute finding. No occlusion or significant stenosis. ILIAC ARTERIES: No acute finding. No occlusion or significant stenosis. ABDOMEN/PELVIS: LOWER CHEST: Visualized portion of the lower chest demonstrates no acute abnormality. LIVER: The liver is unremarkable. GALLBLADDER AND BILE DUCTS: Cholecystectomy. No biliary ductal dilatation. SPLEEN: The spleen is unremarkable. PANCREAS: The pancreas is unremarkable. ADRENAL GLANDS: Bilateral adrenal glands demonstrate no acute abnormality. KIDNEYS, URETERS AND BLADDER: No stones in the kidneys or ureters. No hydronephrosis. No perinephric or periureteral stranding. Urinary bladder  is unremarkable. GI AND BOWEL: Descending and sigmoid colon diverticulosis without evidence of diverticulitis. Stomach and duodenal sweep demonstrate no acute abnormality. There is no bowel obstruction.  No abnormal bowel wall thickening or distension. REPRODUCTIVE: Reproductive organs are unremarkable. PERITONEUM AND RETROPERITONEUM: No ascites or free air. LYMPH NODES: No lymphadenopathy. BONES AND SOFT TISSUES: No acute abnormality of the bones. No acute soft tissue abnormality. IMPRESSION: 1. No active GI bleeding. 2. Descending and sigmoid colon diverticulosis without evidence of diverticulitis. Electronically signed by: Norman Gatlin MD 02/07/2024 09:27 PM EDT RP Workstation: [id number]    Medications: I have reviewed the patient's current medications. Prior to Admission:  Medications Prior to Admission  Medication Sig Dispense Refill Last Dose/Taking   albuterol  (PROVENTIL ) (5 MG/ML) 0.5% nebulizer solution Take 1 mL (5 mg total) by nebulization every 6 (six) hours as needed for wheezing or shortness of breath. 20 mL 0 Unknown   albuterol  (VENTOLIN  HFA) 108 (90 Base) MCG/ACT inhaler Inhale 2 puffs into the lungs every 4 (four) hours as needed for wheezing or shortness of breath.   Unknown   amLODipine (NORVASC) 5 MG tablet Take 5 mg by mouth in the morning.   02/07/2024 Morning   ARIPiprazole (ABILIFY) 10 MG tablet Take 20 mg by mouth at bedtime.   02/06/2024   benzonatate (TESSALON) 200 MG capsule Take 200 mg by mouth 3 (three) times daily as needed for cough.   02/06/2024   budesonide-formoterol (SYMBICORT) 160-4.5 MCG/ACT inhaler Inhale 2 puffs into the lungs 2 (two) times daily.   02/07/2024 Morning   buPROPion (WELLBUTRIN XL) 150 MG 24 hr tablet Take 150 mg by mouth in the morning.   02/07/2024 Morning   Cholecalciferol (VITAMIN D3) 50 MCG (2000 UT) TABS Take 2,000 Units by mouth daily.   02/07/2024 Morning   clonazePAM (KLONOPIN) 0.5 MG tablet Take 0.5 mg by mouth daily as needed for anxiety.   Unknown   cyproheptadine (PERIACTIN) 4 MG tablet Take 2 mg by mouth in the morning and at bedtime.   02/07/2024 Morning   desonide (DESOWEN) 0.05 % ointment Apply 1 Application topically 2 (two) times  daily as needed (for irritation).   Unknown   DULoxetine (CYMBALTA) 60 MG capsule Take 60 mg by mouth in the morning and at bedtime.   02/07/2024 Morning   esomeprazole (NEXIUM) 40 MG capsule Take 40 mg by mouth 2 (two) times daily before a meal.   02/07/2024 Morning   FASENRA PEN 30 MG/ML prefilled autoinjector Inject 30 mg into the skin every 8 (eight) weeks.   01/23/2024   fluticasone (FLONASE) 50 MCG/ACT nasal spray Place 2 sprays into both nostrils in the morning.   02/07/2024 Morning   frovatriptan (FROVA) 2.5 MG tablet Take 2.5 mg by mouth as needed for migraine (and may repeat after 2 hours, if no relief- max of 7.5 mg/24 hours).   Unknown   hydroxychloroquine (PLAQUENIL) 200 MG tablet Take 400 mg by mouth in the morning.   02/07/2024 Morning   ketoconazole (NIZORAL) 2 % cream Apply 1 Application topically daily as needed for irritation.   Unknown   levETIRAcetam (KEPPRA) 500 MG tablet Take 500 mg by mouth in the morning and at bedtime.   02/07/2024 Morning   montelukast (SINGULAIR) 10 MG tablet Take 10 mg by mouth at bedtime.   02/06/2024   polyethylene glycol (MIRALAX ) 17 g packet Take 17 g by mouth daily. (Patient taking differently: Take 17 g by mouth daily as needed for mild constipation.) 14 each  0 Unknown   promethazine (PHENERGAN) 12.5 MG tablet Take 12.5 mg by mouth every 8 (eight) hours as needed for nausea or vomiting.   Unknown   promethazine-dextromethorphan (PROMETHAZINE-DM) 6.25-15 MG/5ML syrup Take 5 mLs by mouth 4 (four) times daily as needed for cough.   Unknown   SPIRIVA RESPIMAT 1.25 MCG/ACT AERS Inhale 2 puffs into the lungs in the morning.   02/07/2024 Morning   tacrolimus (PROTOPIC) 0.1 % ointment Apply 1 Application topically 2 (two) times daily as needed (for irritation).   Unknown   Topiramate ER (TROKENDI XR) 200 MG CP24 Take 200 mg by mouth at bedtime.   02/06/2024   TYLENOL  500 MG tablet Take 500 mg by mouth every 6 (six) hours as needed for mild pain (pain score 1-3) (or  headaches).   Unknown   zolpidem (AMBIEN) 5 MG tablet Take 5 mg by mouth at bedtime as needed for sleep.   Unknown   Albuterol -Budesonide (AIRSUPRA ) 90-80 MCG/ACT AERO Inhale 2 puffs into the lungs 4 (four) times daily as needed. (Patient not taking: Reported on 02/08/2024) 10.7 g 11 Not Taking   HYDROcodone  bit-homatropine (HYCODAN) 5-1.5 MG/5ML syrup Take 5 mLs by mouth every 6 (six) hours as needed for cough. (Patient not taking: Reported on 02/08/2024) 120 mL 0 Not Taking   hydrocortisone  (ANUSOL -HC) 2.5 % rectal cream Place 1 Application rectally 2 (two) times daily. (Patient not taking: Reported on 02/08/2024) 30 g 0 Not Taking   methylPREDNISolone  (MEDROL  DOSEPAK) 4 MG TBPK tablet Use as directed (Patient not taking: Reported on 02/08/2024) 21 each 0 Not Taking   Scheduled:  ARIPiprazole  10 mg Oral BID   buPROPion  150 mg Oral Daily   cyproheptadine  4 mg Oral BID   docusate sodium  100 mg Oral BID   DULoxetine  60 mg Oral BID   fluticasone furoate-vilanterol  1 puff Inhalation Daily   hydrocortisone   1 Application Rectal BID   levETIRAcetam  500 mg Oral BID   montelukast  10 mg Oral QHS   Topiramate ER  200 mg Oral QHS   Continuous:  promethazine (PHENERGAN) injection (IM or IVPB) 12.5 mg (02/08/24 1849)   PRN:acetaminophen  **OR** acetaminophen , albuterol , clonazePAM, ondansetron **OR** ondansetron (ZOFRAN) IV, promethazine (PHENERGAN) injection (IM or IVPB), zolpidem  Assessment/Plan: 1) Lower GI bleed-thought to be due to diverticulosis-CTA was negative. Agree with discharge tomorrow if she is stable. Patient advised to take Colace along with a high fiber diet and liberal fluid intake.  2) Migraine headaches.  3) Asthma.  4) Seizure disorder.  LOS: 1 day   Renaye Sous 02/09/2024, 3:02 PM

## 2024-02-09 NOTE — Progress Notes (Signed)
 PROGRESS NOTE    Alexis Hall  FMW:[id number] DOB: Nov 06, 1970 DOA: 02/07/2024 PCP: Lelon Norleen ORN., MD    Brief Narrative:  53 y.o. female with medical history significant of diverticulosis, internal hemorrhoids, asthma, migraine headache admitted with 3 days on intermittent fresh rectal bleeding. Patient had similar self-limiting bleeding last year, followed by a colonoscopy that showed some polyps and left-sided diverticulosis.  She also was found to have internal hemorrhoids. ED Course: Hemodynamically stable.  Hemoglobin at about baseline.  She was given pain medications.  IV fluids.   Subjective: Patient seen and examined.  She had episodes of nausea with some left lower quadrant cramping but improved now.  She thinks she can do full liquid diet.  Will advance diet.  She had small amount of bleeding wiped by tissue paper yesterday evening but no bowel movement yet. Assessment & Plan:   Lower GI bleed: Suspect diverticular bleed. CT angio negative for active bleeding.  Hemoglobin is stable.  Continue maintenance IV fluids.  Close monitoring.  Recheck hemoglobin tomorrow morning.  Adequate pain medications.  On clear liquids, will advance to full liquid diet.  Will start patient on Colace 100 mg twice daily. Followed by GI.  Anticipating conservative management.  Moderate persistent asthma: Currently stable.  Bronchodilator therapy and steroid inhalers.  Migraine headache and seizure disorder: On multiple medications including Abilify, Wellbutrin, cyproheptadine, Cymbalta, Keppra and topiramate.  Continue.    DVT prophylaxis: SCDs Start: 02/08/24 1401   Code Status: Full code Family Communication: None at the bedside Disposition Plan: Status is: Inpatient Remains inpatient appropriate because: Lower GI bleeding, monitoring     Consultants:  GI  Procedures:  None  Antimicrobials:  None     Objective: Vitals:   02/08/24 2227 02/09/24 0230 02/09/24 0441 02/09/24 0816   BP: 133/77 (!) 140/80 (!) 152/84   Pulse: 71 73 74   Resp:      Temp: 98.7 F (37.1 C) 99.6 F (37.6 C) 98.7 F (37.1 C)   TempSrc: Oral Oral Oral   SpO2: 97% 96% 100% 97%  Weight:      Height:        Intake/Output Summary (Last 24 hours) at 02/09/2024 1327 Last data filed at 02/08/2024 2130 Gross per 24 hour  Intake 704.52 ml  Output 500 ml  Net 204.52 ml   Filed Weights   02/07/24 1953  Weight: 84.8 kg    Examination:  General exam: Appears calm and comfortable  Respiratory system: Clear to auscultation. Respiratory effort normal. Cardiovascular system: S1 & S2 heard, RRR. No JVD, murmurs, rubs, gallops or clicks. No pedal edema. Gastrointestinal system: Abdomen is nondistended, soft and nontender. No organomegaly or masses felt. Normal bowel sounds heard. Central nervous system: Alert and oriented. No focal neurological deficits. Extremities: Symmetric 5 x 5 power. Skin: No rashes, lesions or ulcers Psychiatry: Judgement and insight appear normal. Mood & affect appropriate.     Data Reviewed: I have personally reviewed following labs and imaging studies  CBC: Recent Labs  Lab 02/07/24 2007 02/08/24 0209 02/08/24 1509 02/09/24 0354  WBC 5.4  --  6.2 4.9  NEUTROABS 2.8  --   --  3.2  HGB 11.9* 10.9* 12.9 12.4  HCT 36.9 33.8* 42.2 39.0  MCV 96.1  --  99.5 100.5*  PLT 186  --  192 167   Basic Metabolic Panel: Recent Labs  Lab 02/07/24 2007  NA 140  K 4.1  CL 110  CO2 21*  GLUCOSE 92  BUN  11  CREATININE 0.82  CALCIUM 9.1   GFR: Estimated Creatinine Clearance: 80.2 mL/min (by C-G formula based on SCr of 0.82 mg/dL). Liver Function Tests: Recent Labs  Lab 02/07/24 2007  AST 21  ALT 29  ALKPHOS 66  BILITOT 0.2  PROT 6.3*  ALBUMIN 4.2   Recent Labs  Lab 02/07/24 2007  LIPASE 28   No results for input(s): AMMONIA in the last 168 hours. Coagulation Profile: No results for input(s): INR, PROTIME in the last 168 hours. Cardiac  Enzymes: No results for input(s): CKTOTAL, CKMB, CKMBINDEX, TROPONINI in the last 168 hours. BNP (last 3 results) No results for input(s): PROBNP in the last 8760 hours. HbA1C: No results for input(s): HGBA1C in the last 72 hours. CBG: No results for input(s): GLUCAP in the last 168 hours. Lipid Profile: No results for input(s): CHOL, HDL, LDLCALC, TRIG, CHOLHDL, LDLDIRECT in the last 72 hours. Thyroid Function Tests: No results for input(s): TSH, T4TOTAL, FREET4, T3FREE, THYROIDAB in the last 72 hours. Anemia Panel: No results for input(s): VITAMINB12, FOLATE, FERRITIN, TIBC, IRON, RETICCTPCT in the last 72 hours. Sepsis Labs: No results for input(s): PROCALCITON, LATICACIDVEN in the last 168 hours.  No results found for this or any previous visit (from the past 240 hours).       Radiology Studies: CT ANGIO GI BLEED Result Date: 02/07/2024 EXAM: CTA ABDOMEN AND PELVIS WITH CONTRAST 02/07/2024 09:15:00 PM TECHNIQUE: CTA images of the abdomen and pelvis with intravenous contrast. 75 mL (iohexol  (OMNIPAQUE ) 350 MG/ML injection 75 mL IOHEXOL  350 MG/ML SOLN) was administered. Three-dimensional MIP/volume rendered formations were performed. Automated exposure control, iterative reconstruction, and/or weight based adjustment of the mA/kV was utilized to reduce the radiation dose to as low as reasonably achievable. COMPARISON: CT 10/19/2023 CLINICAL HISTORY: GI bleed. Patient reports rectal bleeding and LLQ pain; was evaluated 2 days ago for hemorrhoids and rectal bleeding, but did not have the abdominal pain at that time; +nausea. FINDINGS: VASCULATURE: GI BLEED: No active GI bleeding. AORTA: No acute finding. No abdominal aortic aneurysm. No dissection. CELIAC TRUNK: No acute finding. No occlusion or significant stenosis. SUPERIOR MESENTERIC ARTERY: No acute finding. No occlusion or significant stenosis. INFERIOR MESENTERIC ARTERY: No acute  finding. No occlusion or significant stenosis. RENAL ARTERIES: No acute finding. No occlusion or significant stenosis. ILIAC ARTERIES: No acute finding. No occlusion or significant stenosis. ABDOMEN/PELVIS: LOWER CHEST: Visualized portion of the lower chest demonstrates no acute abnormality. LIVER: The liver is unremarkable. GALLBLADDER AND BILE DUCTS: Cholecystectomy. No biliary ductal dilatation. SPLEEN: The spleen is unremarkable. PANCREAS: The pancreas is unremarkable. ADRENAL GLANDS: Bilateral adrenal glands demonstrate no acute abnormality. KIDNEYS, URETERS AND BLADDER: No stones in the kidneys or ureters. No hydronephrosis. No perinephric or periureteral stranding. Urinary bladder is unremarkable. GI AND BOWEL: Descending and sigmoid colon diverticulosis without evidence of diverticulitis. Stomach and duodenal sweep demonstrate no acute abnormality. There is no bowel obstruction. No abnormal bowel wall thickening or distension. REPRODUCTIVE: Reproductive organs are unremarkable. PERITONEUM AND RETROPERITONEUM: No ascites or free air. LYMPH NODES: No lymphadenopathy. BONES AND SOFT TISSUES: No acute abnormality of the bones. No acute soft tissue abnormality. IMPRESSION: 1. No active GI bleeding. 2. Descending and sigmoid colon diverticulosis without evidence of diverticulitis. Electronically signed by: Norman Gatlin MD 02/07/2024 09:27 PM EDT RP Workstation: [id number]        Scheduled Meds:  ARIPiprazole  10 mg Oral BID   buPROPion  150 mg Oral Daily   cyproheptadine  4 mg Oral BID  docusate sodium  100 mg Oral BID   DULoxetine  60 mg Oral BID   fluticasone furoate-vilanterol  1 puff Inhalation Daily   hydrocortisone   1 Application Rectal BID   levETIRAcetam  500 mg Oral BID   montelukast  10 mg Oral QHS   Topiramate ER  200 mg Oral QHS   Continuous Infusions:  sodium chloride  100 mL/hr at 02/09/24 0029   promethazine (PHENERGAN) injection (IM or IVPB) 12.5 mg (02/08/24 1849)      LOS: 1 day    Time spent: 45 minutes    Renato Applebaum, MD [facility]

## 2024-02-10 LAB — HEMOGLOBIN AND HEMATOCRIT, BLOOD
HCT: 40.3 % (ref 36.0–46.0)
Hemoglobin: 12.5 g/dL (ref 12.0–15.0)

## 2024-02-10 MED ORDER — POLYETHYLENE GLYCOL 3350 17 G PO PACK: 17.0000 g | PACK | Freq: Every day | ORAL | 0 refills | Status: AC

## 2024-02-10 MED ORDER — ONDANSETRON 4 MG PO TBDP: 4.0000 mg | ORAL_TABLET | Freq: Three times a day (TID) | ORAL | 0 refills | Status: AC | PRN

## 2024-02-10 MED ORDER — DOCUSATE SODIUM 100 MG PO CAPS: 100.0000 mg | ORAL_CAPSULE | Freq: Two times a day (BID) | ORAL | 0 refills | Status: AC

## 2024-02-10 NOTE — Discharge Summary (Signed)
 Physician Discharge Summary  Alexis Hall FMW:[id number] DOB: 1971/04/19 DOA: 02/07/2024  PCP: Lelon Norleen ORN., MD  Admit date: 02/07/2024 Discharge date: 02/10/2024  Admitted From: Home Disposition: Home  Recommendations for Outpatient Follow-up:  Follow up with PCP in 1-2 weeks Please obtain BMP/CBC in one week Schedule follow-up with your gastroenterologist in 4 to 6 weeks  Discharge Condition: Stable CODE STATUS: Full code Diet recommendation: Regular diet, high-fiber diet, stool softener.  Discharge summary: 53 year old known diverticulosis and internal hemorrhoids, asthma and anxiety migraine headache presented with 3 days of small-volume intermittent fresh rectal bleeding with some left abdominal cramping and nausea.  She had similar self-limiting bleeding last year and had colonoscopy after that that showed polyp and left-sided diverticulosis.  She was admitted for treatment.  Patient was also followed by GI.  Treated symptomatically and conservatively with known diverticulosis.  Clinically improved.  Trace bleeding but mostly overall improved.  No drop in hemoglobin.  Going home.  Discussed about stool softener and laxatives and fiber diet.  She will schedule follow-up with her gastroenterology for follow-up.  Continue all home medications.  She is not on any blood thinners.    Discharge Diagnoses:  Principal Problem:   GI bleed    Discharge Instructions  Discharge Instructions     Diet - low sodium heart healthy   Complete by: As directed    Discharge instructions   Complete by: As directed    Call and schedule follow up with your Gastroenterology   Increase activity slowly   Complete by: As directed       Allergies as of 02/10/2024       Reactions   Carbamazepine Swelling, Other (See Comments)   Lips became swollen   Hydrocodone -acetaminophen  Other (See Comments)   GI upset- can tolerate Hycodan   Fluconazole Rash   Lamictal [lamotrigine] Rash         Medication List     STOP taking these medications    Airsupra  90-80 MCG/ACT Aero Generic drug: Albuterol -Budesonide   HYDROcodone  bit-homatropine 5-1.5 MG/5ML syrup Commonly known as: Hycodan   methylPREDNISolone  4 MG Tbpk tablet Commonly known as: MEDROL  DOSEPAK   promethazine 12.5 MG tablet Commonly known as: PHENERGAN   promethazine-dextromethorphan 6.25-15 MG/5ML syrup Commonly known as: PROMETHAZINE-DM       TAKE these medications    albuterol  108 (90 Base) MCG/ACT inhaler Commonly known as: VENTOLIN  HFA Inhale 2 puffs into the lungs every 4 (four) hours as needed for wheezing or shortness of breath.   albuterol  (5 MG/ML) 0.5% nebulizer solution Commonly known as: PROVENTIL  Take 1 mL (5 mg total) by nebulization every 6 (six) hours as needed for wheezing or shortness of breath.   amLODipine 5 MG tablet Commonly known as: NORVASC Take 5 mg by mouth in the morning.   ARIPiprazole 10 MG tablet Commonly known as: ABILIFY Take 20 mg by mouth at bedtime.   benzonatate 200 MG capsule Commonly known as: TESSALON Take 200 mg by mouth 3 (three) times daily as needed for cough.   budesonide-formoterol 160-4.5 MCG/ACT inhaler Commonly known as: SYMBICORT Inhale 2 puffs into the lungs 2 (two) times daily.   buPROPion 150 MG 24 hr tablet Commonly known as: WELLBUTRIN XL Take 150 mg by mouth in the morning.   clonazePAM 0.5 MG tablet Commonly known as: KLONOPIN Take 0.5 mg by mouth daily as needed for anxiety.   cyproheptadine 4 MG tablet Commonly known as: PERIACTIN Take 2 mg by mouth in the morning and at  bedtime.   desonide 0.05 % ointment Commonly known as: DESOWEN Apply 1 Application topically 2 (two) times daily as needed (for irritation).   docusate sodium 100 MG capsule Commonly known as: COLACE Take 1 capsule (100 mg total) by mouth 2 (two) times daily.   DULoxetine 60 MG capsule Commonly known as: CYMBALTA Take 60 mg by mouth in the morning and  at bedtime.   esomeprazole 40 MG capsule Commonly known as: NEXIUM Take 40 mg by mouth 2 (two) times daily before a meal.   Fasenra Pen 30 MG/ML prefilled autoinjector Generic drug: benralizumab Inject 30 mg into the skin every 8 (eight) weeks.   fluticasone 50 MCG/ACT nasal spray Commonly known as: FLONASE Place 2 sprays into both nostrils in the morning.   frovatriptan 2.5 MG tablet Commonly known as: FROVA Take 2.5 mg by mouth as needed for migraine (and may repeat after 2 hours, if no relief- max of 7.5 mg/24 hours).   hydrocortisone  2.5 % rectal cream Commonly known as: ANUSOL -HC Place 1 Application rectally 2 (two) times daily.   hydroxychloroquine 200 MG tablet Commonly known as: PLAQUENIL Take 400 mg by mouth in the morning.   ketoconazole 2 % cream Commonly known as: NIZORAL Apply 1 Application topically daily as needed for irritation.   levETIRAcetam 500 MG tablet Commonly known as: KEPPRA Take 500 mg by mouth in the morning and at bedtime.   montelukast 10 MG tablet Commonly known as: SINGULAIR Take 10 mg by mouth at bedtime.   ondansetron 4 MG disintegrating tablet Commonly known as: ZOFRAN-ODT Take 1 tablet (4 mg total) by mouth every 8 (eight) hours as needed for nausea or vomiting.   polyethylene glycol 17 g packet Commonly known as: MiraLax  Take 17 g by mouth daily. What changed:  when to take this reasons to take this   Spiriva Respimat 1.25 MCG/ACT Aers Generic drug: Tiotropium Bromide Monohydrate Inhale 2 puffs into the lungs in the morning.   tacrolimus 0.1 % ointment Commonly known as: PROTOPIC Apply 1 Application topically 2 (two) times daily as needed (for irritation).   Topiramate ER 200 MG Cp24 Commonly known as: TROKENDI XR Take 200 mg by mouth at bedtime.   TYLENOL  500 MG tablet Generic drug: acetaminophen  Take 500 mg by mouth every 6 (six) hours as needed for mild pain (pain score 1-3) (or headaches).   Vitamin D3 50 MCG  (2000 UT) Tabs Take 2,000 Units by mouth daily.   zolpidem 5 MG tablet Commonly known as: AMBIEN Take 5 mg by mouth at bedtime as needed for sleep.        Allergies  Allergen Reactions   Carbamazepine Swelling and Other (See Comments)    Lips became swollen   Hydrocodone -Acetaminophen  Other (See Comments)    GI upset- can tolerate Hycodan   Fluconazole Rash   Lamictal [Lamotrigine] Rash    Consultations: Gastroenterology   Procedures/Studies: CT ANGIO GI BLEED Result Date: 02/07/2024 EXAM: CTA ABDOMEN AND PELVIS WITH CONTRAST 02/07/2024 09:15:00 PM TECHNIQUE: CTA images of the abdomen and pelvis with intravenous contrast. 75 mL (iohexol  (OMNIPAQUE ) 350 MG/ML injection 75 mL IOHEXOL  350 MG/ML SOLN) was administered. Three-dimensional MIP/volume rendered formations were performed. Automated exposure control, iterative reconstruction, and/or weight based adjustment of the mA/kV was utilized to reduce the radiation dose to as low as reasonably achievable. COMPARISON: CT 10/19/2023 CLINICAL HISTORY: GI bleed. Patient reports rectal bleeding and LLQ pain; was evaluated 2 days ago for hemorrhoids and rectal bleeding, but did not have  the abdominal pain at that time; +nausea. FINDINGS: VASCULATURE: GI BLEED: No active GI bleeding. AORTA: No acute finding. No abdominal aortic aneurysm. No dissection. CELIAC TRUNK: No acute finding. No occlusion or significant stenosis. SUPERIOR MESENTERIC ARTERY: No acute finding. No occlusion or significant stenosis. INFERIOR MESENTERIC ARTERY: No acute finding. No occlusion or significant stenosis. RENAL ARTERIES: No acute finding. No occlusion or significant stenosis. ILIAC ARTERIES: No acute finding. No occlusion or significant stenosis. ABDOMEN/PELVIS: LOWER CHEST: Visualized portion of the lower chest demonstrates no acute abnormality. LIVER: The liver is unremarkable. GALLBLADDER AND BILE DUCTS: Cholecystectomy. No biliary ductal dilatation. SPLEEN: The  spleen is unremarkable. PANCREAS: The pancreas is unremarkable. ADRENAL GLANDS: Bilateral adrenal glands demonstrate no acute abnormality. KIDNEYS, URETERS AND BLADDER: No stones in the kidneys or ureters. No hydronephrosis. No perinephric or periureteral stranding. Urinary bladder is unremarkable. GI AND BOWEL: Descending and sigmoid colon diverticulosis without evidence of diverticulitis. Stomach and duodenal sweep demonstrate no acute abnormality. There is no bowel obstruction. No abnormal bowel wall thickening or distension. REPRODUCTIVE: Reproductive organs are unremarkable. PERITONEUM AND RETROPERITONEUM: No ascites or free air. LYMPH NODES: No lymphadenopathy. BONES AND SOFT TISSUES: No acute abnormality of the bones. No acute soft tissue abnormality. IMPRESSION: 1. No active GI bleeding. 2. Descending and sigmoid colon diverticulosis without evidence of diverticulitis. Electronically signed by: Norman Gatlin MD 02/07/2024 09:27 PM EDT RP Workstation: [id number]   (Echo, Carotid, EGD, Colonoscopy, ERCP)    Subjective: Patient seen and examined.  No overnight events.  Denies any dizziness lightheadedness.  Occasional nausea but no pain.  She has not noticed any blood or bowel movement since yesterday morning.  Feels comfortable with plan to go home.   Discharge Exam: Vitals:   02/09/24 2133 02/10/24 0530  BP: 132/77 128/76  Pulse: 82 69  Resp:  15  Temp: 97.9 F (36.6 C) 98.7 F (37.1 C)  SpO2: 99% 96%   Vitals:   02/09/24 0816 02/09/24 1339 02/09/24 2133 02/10/24 0530  BP:  (!) 146/84 132/77 128/76  Pulse:  79 82 69  Resp:  20  15  Temp:  98.7 F (37.1 C) 97.9 F (36.6 C) 98.7 F (37.1 C)  TempSrc:  Oral Oral Oral  SpO2: 97% 99% 99% 96%  Weight:      Height:        General: Pt is alert, awake, not in acute distress Cardiovascular: RRR, S1/S2 +, no rubs, no gallops Respiratory: CTA bilaterally, no wheezing, no rhonchi Abdominal: Soft, NT, ND, bowel sounds + Extremities:  no edema, no cyanosis    The results of significant diagnostics from this hospitalization (including imaging, microbiology, ancillary and laboratory) are listed below for reference.     Microbiology: No results found for this or any previous visit (from the past 240 hours).   Labs: BNP (last 3 results) No results for input(s): BNP in the last 8760 hours. Basic Metabolic Panel: Recent Labs  Lab 02/07/24 2007  NA 140  K 4.1  CL 110  CO2 21*  GLUCOSE 92  BUN 11  CREATININE 0.82  CALCIUM 9.1   Liver Function Tests: Recent Labs  Lab 02/07/24 2007  AST 21  ALT 29  ALKPHOS 66  BILITOT 0.2  PROT 6.3*  ALBUMIN 4.2   Recent Labs  Lab 02/07/24 2007  LIPASE 28   No results for input(s): AMMONIA in the last 168 hours. CBC: Recent Labs  Lab 02/07/24 2007 02/08/24 0209 02/08/24 1509 02/09/24 0354 02/10/24 0447  WBC 5.4  --  6.2 4.9  --   NEUTROABS 2.8  --   --  3.2  --   HGB 11.9* 10.9* 12.9 12.4 12.5  HCT 36.9 33.8* 42.2 39.0 40.3  MCV 96.1  --  99.5 100.5*  --   PLT 186  --  192 167  --    Cardiac Enzymes: No results for input(s): CKTOTAL, CKMB, CKMBINDEX, TROPONINI in the last 168 hours. BNP: Invalid input(s): POCBNP CBG: No results for input(s): GLUCAP in the last 168 hours. D-Dimer No results for input(s): DDIMER in the last 72 hours. Hgb A1c No results for input(s): HGBA1C in the last 72 hours. Lipid Profile No results for input(s): CHOL, HDL, LDLCALC, TRIG, CHOLHDL, LDLDIRECT in the last 72 hours. Thyroid function studies No results for input(s): TSH, T4TOTAL, T3FREE, THYROIDAB in the last 72 hours.  Invalid input(s): FREET3 Anemia work up No results for input(s): VITAMINB12, FOLATE, FERRITIN, TIBC, IRON, RETICCTPCT in the last 72 hours. Urinalysis    Component Value Date/Time   COLORURINE YELLOW 02/07/2024 2021   APPEARANCEUR CLEAR 02/07/2024 2021   LABSPEC 1.015 02/07/2024 2021   PHURINE  6.5 02/07/2024 2021   GLUCOSEU NEGATIVE 02/07/2024 2021   HGBUR NEGATIVE 02/07/2024 2021   BILIRUBINUR NEGATIVE 02/07/2024 2021   KETONESUR NEGATIVE 02/07/2024 2021   PROTEINUR NEGATIVE 02/07/2024 2021   NITRITE NEGATIVE 02/07/2024 2021   LEUKOCYTESUR TRACE (A) 02/07/2024 2021   Sepsis Labs Recent Labs  Lab 02/07/24 2007 02/08/24 1509 02/09/24 0354  WBC 5.4 6.2 4.9   Microbiology No results found for this or any previous visit (from the past 240 hours).   Time coordinating discharge: 35 minutes  SIGNED:   Renato Applebaum, MD  [facility] 02/10/2024, 9:07 AM

## 2024-04-14 ENCOUNTER — Emergency Department (HOSPITAL_BASED_OUTPATIENT_CLINIC_OR_DEPARTMENT_OTHER)

## 2024-04-14 ENCOUNTER — Other Ambulatory Visit: Payer: Self-pay

## 2024-04-14 ENCOUNTER — Encounter (HOSPITAL_BASED_OUTPATIENT_CLINIC_OR_DEPARTMENT_OTHER): Payer: Self-pay

## 2024-04-14 ENCOUNTER — Emergency Department (HOSPITAL_BASED_OUTPATIENT_CLINIC_OR_DEPARTMENT_OTHER)
Admission: EM | Admit: 2024-04-14 | Discharge: 2024-04-14 | Disposition: A | Discharge status: Home / Self Care | Attending: Emergency Medicine | Admitting: Emergency Medicine

## 2024-04-14 DIAGNOSIS — S0990XA Unspecified injury of head, initial encounter: Secondary | ICD-10-CM | POA: Diagnosis present

## 2024-04-14 DIAGNOSIS — W228XXA Striking against or struck by other objects, initial encounter: Secondary | ICD-10-CM | POA: Insufficient documentation

## 2024-04-14 MED ORDER — DIPHENHYDRAMINE HCL 50 MG/ML IJ SOLN
12.5000 mg | Freq: Once | INTRAMUSCULAR | Status: AC
  Administered 2024-04-14: 12.5 mg via INTRAVENOUS
  Filled 2024-04-14: qty 1

## 2024-04-14 MED ORDER — DEXAMETHASONE SOD PHOSPHATE PF 10 MG/ML IJ SOLN
10.0000 mg | Freq: Once | INTRAMUSCULAR | Status: AC
  Administered 2024-04-14: 10 mg via INTRAVENOUS

## 2024-04-14 MED ORDER — PROCHLORPERAZINE EDISYLATE 10 MG/2ML IJ SOLN
5.0000 mg | Freq: Once | INTRAMUSCULAR | Status: AC
  Administered 2024-04-14: 5 mg via INTRAVENOUS
  Filled 2024-04-14: qty 2

## 2024-04-14 NOTE — ED Provider Notes (Signed)
 [place] [facility] Provider Note   CSN: [id number] Arrival date & time: 04/14/24  1212     Patient presents with: Head Injury   Alexis Hall is a 53 y.o. female.   Patient to ED with complaint of headache. She reports 3 days ago she opened a door and the peg for coats hit her in the right frontal scalp. She did not fall or pass out but reports she has had an on-an-off headache described as burning and intense. She has had nausea, photosensitivity. She has a history of migraine headaches and took her prescribed medications without relief. She reports blurred vision. Today the headache worsened prompting ED evaluation.   The history is provided by the patient. No language interpreter was used.  Head Injury      Prior to Admission medications   Medication Sig Start Date End Date Taking? Authorizing Provider  albuterol  (PROVENTIL ) (5 MG/ML) 0.5% nebulizer solution Take 1 mL (5 mg total) by nebulization every 6 (six) hours as needed for wheezing or shortness of breath. 06/04/23   Patt Alm Macho, MD  albuterol  (VENTOLIN  HFA) 108 [phone] Base) MCG/ACT inhaler Inhale 2 puffs into the lungs every 4 (four) hours as needed for wheezing or shortness of breath.    [provider]  amLODipine (NORVASC) 5 MG tablet Take 5 mg by mouth in the morning. 06/07/22   [provider]  ARIPiprazole  (ABILIFY ) 10 MG tablet Take 20 mg by mouth at bedtime.    [provider]  benzonatate (TESSALON) 200 MG capsule Take 200 mg by mouth 3 (three) times daily as needed for cough.    [provider]  budesonide-formoterol (SYMBICORT) 160-4.5 MCG/ACT inhaler Inhale 2 puffs into the lungs 2 (two) times daily.    [provider]  buPROPion  (WELLBUTRIN  XL) 150 MG 24 hr tablet Take 150 mg by mouth in the morning.    [provider]  Cholecalciferol (VITAMIN D3) 50 MCG (2000 UT) TABS Take 2,000 Units by mouth daily.    [provider]  clonazePAM  (KLONOPIN ) 0.5 MG tablet Take 0.5 mg by mouth daily as needed for anxiety.    [provider]  cyproheptadine  (PERIACTIN ) 4 MG tablet Take 2 mg by mouth in the morning and at bedtime.    [provider]  desonide (DESOWEN) 0.05 % ointment Apply 1 Application topically 2 (two) times daily as needed (for irritation).    [provider]  docusate sodium  (COLACE) 100 MG capsule Take 1 capsule (100 mg total) by mouth 2 (two) times daily. 02/10/24   Ghimire, Kuber, MD  DULoxetine  (CYMBALTA ) 60 MG capsule Take 60 mg by mouth in the morning and at bedtime. 03/04/22   [provider]  esomeprazole (NEXIUM) 40 MG capsule Take 40 mg by mouth 2 (two) times daily before a meal. 03/01/22   [provider]  FASENRA PEN 30 MG/ML prefilled autoinjector Inject 30 mg into the skin every 8 (eight) weeks.    [provider]  fluticasone  (FLONASE) 50 MCG/ACT nasal spray Place 2 sprays into both nostrils in the morning.    [provider]  frovatriptan (FROVA) 2.5 MG tablet Take 2.5 mg by mouth as needed for migraine (and may repeat after 2 hours, if no relief- max of 7.5 mg/24 hours).    [provider]  hydrocortisone  (ANUSOL -HC) 2.5 % rectal cream Place 1 Application rectally 2 (two) times daily. Patient not taking: Reported on 02/08/2024 05/18/23  Rancour, Garnette, MD  hydroxychloroquine (PLAQUENIL) 200 MG tablet Take 400 mg by mouth in the morning.    [provider]  ketoconazole (NIZORAL) 2 % cream Apply 1 Application topically daily as needed for irritation.    [provider]  levETIRAcetam  (KEPPRA ) 500 MG tablet Take 500 mg by mouth in the morning and at bedtime.    [provider]  montelukast  (SINGULAIR ) 10 MG tablet Take 10 mg by mouth at bedtime.    [provider]  ondansetron  (ZOFRAN -ODT) 4 MG disintegrating tablet Take 1 tablet (4 mg total) by mouth every 8 (eight) hours as  needed for nausea or vomiting. 02/10/24   Ghimire, Kuber, MD  polyethylene glycol (MIRALAX ) 17 g packet Take 17 g by mouth daily. 02/10/24   Ghimire, Kuber, MD  SPIRIVA RESPIMAT 1.25 MCG/ACT AERS Inhale 2 puffs into the lungs in the morning.    [provider]  tacrolimus (PROTOPIC) 0.1 % ointment Apply 1 Application topically 2 (two) times daily as needed (for irritation).    [provider]  Topiramate  ER (TROKENDI  XR) 200 MG CP24 Take 200 mg by mouth at bedtime.    [provider]  TYLENOL  500 MG tablet Take 500 mg by mouth every 6 (six) hours as needed for mild pain (pain score 1-3) (or headaches).    [provider]  zolpidem  (AMBIEN ) 5 MG tablet Take 5 mg by mouth at bedtime as needed for sleep.    [provider]    Allergies: Carbamazepine, Hydrocodone -acetaminophen , Fluconazole, and Lamictal [lamotrigine]    Review of Systems  Updated Vital Signs BP (!) 143/80 (BP Location: Right Arm)   Pulse 90   Temp 98.3 F (36.8 C)   Resp 18   LMP 07/23/2016 (Exact Date)   SpO2 98%   Physical Exam Vitals and nursing note reviewed.  Constitutional:      Appearance: She is well-developed.  HENT:     Head: Normocephalic.  Eyes:     Pupils: Pupils are equal, round, and reactive to light.  Neck:     Comments: No midline cervical tenderness.  Cardiovascular:     Rate and Rhythm: Normal rate and regular rhythm.  Pulmonary:     Effort: Pulmonary effort is normal.     Breath sounds: Normal breath sounds.  Abdominal:     General: Bowel sounds are normal.     Palpations: Abdomen is soft.     Tenderness: There is no abdominal tenderness. There is no guarding or rebound.  Musculoskeletal:        General: Normal range of motion.     Cervical back: Normal range of motion and neck supple.  Skin:    General: Skin is warm and dry.     Findings: No rash.  Neurological:     Mental Status: She is alert and oriented to person, place, and time.      GCS: GCS eye subscore is 4. GCS verbal subscore is 5. GCS motor subscore is 6.     Cranial Nerves: No cranial nerve deficit, dysarthria or facial asymmetry.     Sensory: No sensory deficit.     Motor: No weakness or pronator drift.     Coordination: Finger-Nose-Finger Test normal.     Deep Tendon Reflexes: Reflexes are normal and symmetric.     Reflex Scores:      Patellar reflexes are 2+ on the right side and 2+ on the left side.    (all labs ordered are listed,  but only abnormal results are displayed) Labs Reviewed - No data to display  EKG: None  Radiology: CT Head Wo Contrast Result Date: 04/14/2024 EXAM: CT HEAD WITHOUT CONTRAST 04/14/2024 12:54:30 PM TECHNIQUE: CT of the head was performed without the administration of intravenous contrast. Automated exposure control, iterative reconstruction, and/or weight based adjustment of the mA/kV was utilized to reduce the radiation dose to as low as reasonably achievable. COMPARISON: Head CT 10/28/2022. CLINICAL HISTORY: 53 year old female with trauma, Hit top head on hook on stall door on Sunday. Ha, dizziness, blurry vision, nausea. FINDINGS: BRAIN AND VENTRICLES: No acute hemorrhage. No evidence of acute infarct. No hydrocephalus. No extra-axial collection. No mass effect or midline shift. Stable brain volume, within normal limits for age. No suspicious intracranial vascular hyperdensity. Gray white differentiation is stable and within normal limits. ORBITS: No acute abnormality. SINUSES: Paranasal sinuses, middle ears and mastoids remain well aerated. SOFT TISSUES AND SKULL: No acute soft tissue abnormality. No discrete orbital scalp soft tissue injury. No skull fracture. Stable visible osseous structures. IMPRESSION: 1. No recent traumatic injury identified. 2. Normal for age non contrast CT appearance of the brain. Electronically signed by: Helayne Hurst MD 04/14/2024 01:10 PM EST RP Workstation: [id number]     Procedures   Medications  Ordered in the ED  dexamethasone  (DECADRON ) injection 10 mg (10 mg Intravenous Given 04/14/24 1453)  prochlorperazine  (COMPAZINE ) injection 5 mg (5 mg Intravenous Given 04/14/24 1456)  diphenhydrAMINE  (BENADRYL ) injection 12.5 mg (12.5 mg Intravenous Given 04/14/24 1458)    Clinical Course as of 04/14/24 1513  Wed Apr 14, 2024  1434 Patient with recent minor head injury here with progressive headache, nausea, light sensitivity, blurry vision. Also reports history of migraines.   She is overall well appearing with a nonfocal neuro exam. Head CT negative. No concern for cervical injury. Migraine vs post-concussive symptoms. Headache cocktail ordered.  [SU]  1513 Headache is resolved after headache cocktail provided. VSS. She is felt appropriate for discharge home.  [SU]    Clinical Course User Index [SU] Odell Balls, PA-C                                 Medical Decision Making Risk Prescription drug management.        Final diagnoses:  Minor head injury, initial encounter    ED Discharge Orders     None          Odell Balls, PA-C 04/14/24 1514    Pamella Ozell LABOR, DO 04/15/24 1539

## 2024-04-14 NOTE — Discharge Instructions (Signed)
 As we discussed, your minor head injury might have triggered a migraine or there could be a mild concussive syndrome. I a happy your headache is improved with the medications. Get some rest today and tomorrow and plan to see your doctor if headache recurs.   Return to the ED with any new or concerning symptoms at any time.

## 2024-04-14 NOTE — ED Triage Notes (Signed)
 Reports hitting head on stall door on Sunday. Headaches, dizziness, nausea, blurry vision since.  No LOC or blood thinners
